# Patient Record
Sex: Female | Born: 1980 | Smoking: Never smoker
Health system: Southern US, Community
[De-identification: ages and names within clinical notes are randomized; demographics above are authoritative.]

## PROBLEM LIST (undated history)

## (undated) DIAGNOSIS — F419 Anxiety disorder, unspecified: Secondary | ICD-10-CM

## (undated) HISTORY — PX: DILATION AND CURETTAGE OF UTERUS: SHX78

## (undated) HISTORY — DX: Anxiety disorder, unspecified: F41.9

## (undated) HISTORY — PX: LAPAROSCOPY: SHX197

---

## 2017-07-11 ENCOUNTER — Other Ambulatory Visit: Payer: Self-pay | Admitting: Obstetrics and Gynecology

## 2017-07-11 ENCOUNTER — Other Ambulatory Visit (HOSPITAL_COMMUNITY)
Admission: RE | Admit: 2017-07-11 | Discharge: 2017-07-11 | Disposition: A | Discharge status: Home / Self Care | Payer: No Typology Code available for payment source | Source: Ambulatory Visit | Attending: Obstetrics and Gynecology | Admitting: Obstetrics and Gynecology

## 2017-07-11 DIAGNOSIS — Z01419 Encounter for gynecological examination (general) (routine) without abnormal findings: Secondary | ICD-10-CM | POA: Insufficient documentation

## 2017-07-14 LAB — CYTOLOGY - PAP
Diagnosis: NEGATIVE
HPV: NOT DETECTED

## 2017-11-02 ENCOUNTER — Other Ambulatory Visit: Payer: Self-pay | Admitting: Internal Medicine

## 2017-11-02 DIAGNOSIS — R42 Dizziness and giddiness: Secondary | ICD-10-CM

## 2017-11-06 ENCOUNTER — Ambulatory Visit
Admission: RE | Admit: 2017-11-06 | Discharge: 2017-11-06 | Disposition: A | Discharge status: Home / Self Care | Payer: No Typology Code available for payment source | Source: Ambulatory Visit | Attending: Internal Medicine | Admitting: Internal Medicine

## 2017-11-06 DIAGNOSIS — R42 Dizziness and giddiness: Secondary | ICD-10-CM

## 2018-10-25 ENCOUNTER — Other Ambulatory Visit: Payer: Self-pay | Admitting: Gastroenterology

## 2018-10-25 DIAGNOSIS — R1084 Generalized abdominal pain: Secondary | ICD-10-CM

## 2018-11-01 ENCOUNTER — Inpatient Hospital Stay: Admission: RE | Admit: 2018-11-01 | Payer: No Typology Code available for payment source | Source: Ambulatory Visit

## 2018-11-20 ENCOUNTER — Other Ambulatory Visit: Payer: Self-pay | Admitting: Gastroenterology

## 2018-11-20 DIAGNOSIS — R1033 Periumbilical pain: Secondary | ICD-10-CM

## 2018-11-20 DIAGNOSIS — R1084 Generalized abdominal pain: Secondary | ICD-10-CM

## 2018-11-22 ENCOUNTER — Ambulatory Visit
Admission: RE | Admit: 2018-11-22 | Discharge: 2018-11-22 | Disposition: A | Discharge status: Home / Self Care | Payer: PRIVATE HEALTH INSURANCE | Source: Ambulatory Visit | Attending: Gastroenterology | Admitting: Gastroenterology

## 2018-11-22 ENCOUNTER — Other Ambulatory Visit: Payer: Self-pay

## 2018-11-22 DIAGNOSIS — R1084 Generalized abdominal pain: Secondary | ICD-10-CM

## 2018-11-22 DIAGNOSIS — R1033 Periumbilical pain: Secondary | ICD-10-CM

## 2018-11-22 MED ORDER — IOPAMIDOL (ISOVUE-300) INJECTION 61%
100.0000 mL | Freq: Once | INTRAVENOUS | Status: AC | PRN
  Administered 2018-11-22: 100 mL via INTRAVENOUS

## 2019-05-25 ENCOUNTER — Ambulatory Visit: Payer: PRIVATE HEALTH INSURANCE | Attending: Internal Medicine

## 2019-05-25 DIAGNOSIS — Z23 Encounter for immunization: Secondary | ICD-10-CM

## 2019-05-25 NOTE — Progress Notes (Signed)
   Covid-19 Vaccination Clinic  Name:  Danielle Wall    MRN: [id number] DOB: [birth year]  05/25/2019  Ms. [name] was observed post Covid-19 immunization for 15 minutes without incident. She was provided with Vaccine Information Sheet and instruction to access the V-Safe system.   Ms. [name] was instructed to call 911 with any severe reactions post vaccine: Marland Kitchen Difficulty breathing  . Swelling of face and throat  . A fast heartbeat  . A bad rash all over body  . Dizziness and weakness   Immunizations Administered    Name Date Dose VIS Date Route   Moderna COVID-19 Vaccine 05/25/2019 12:16 PM 0.5 mL 01/23/2019 Intramuscular   Manufacturer: Moderna   Lot: [id number]   NDC: 09470-962-83

## 2019-07-24 ENCOUNTER — Ambulatory Visit (INDEPENDENT_AMBULATORY_CARE_PROVIDER_SITE_OTHER): Payer: PRIVATE HEALTH INSURANCE | Admitting: Dermatology

## 2019-07-24 ENCOUNTER — Other Ambulatory Visit: Payer: Self-pay

## 2019-07-24 ENCOUNTER — Encounter: Payer: Self-pay | Admitting: Dermatology

## 2019-07-24 DIAGNOSIS — L0100 Impetigo, unspecified: Secondary | ICD-10-CM | POA: Diagnosis not present

## 2019-07-24 DIAGNOSIS — L2489 Irritant contact dermatitis due to other agents: Secondary | ICD-10-CM | POA: Diagnosis not present

## 2019-07-24 MED ORDER — MUPIROCIN 2 % EX OINT: 1.0000 "application " | TOPICAL_OINTMENT | Freq: Two times a day (BID) | CUTANEOUS | 1 refills | Status: AC

## 2019-07-24 MED ORDER — HYDROCORTISONE 2.5 % EX CREA: TOPICAL_CREAM | Freq: Two times a day (BID) | CUTANEOUS | 1 refills | Status: AC | PRN

## 2019-07-24 NOTE — Progress Notes (Signed)
   Follow-Up Visit   Subjective  Danielle Wall is a 39 y.o. female who presents for the following: Skin Problem (went to have eyebrows and upper lip waxed saturday- "ripped skin off" tx- merderma and vasoline).  Rash Location: Face Duration: 3 days Quality: Peeling crusts Associated Signs/Symptoms: Modifying Factors: Waxing for facial hair Severity:  Timing: Context:   The following portions of the chart were reviewed this encounter and updated as appropriate:     Objective  Well appearing patient in no apparent distress; mood and affect are within normal limits.  A focused examination was performed including Head and neck.. Relevant physical exam findings are noted in the Assessment and Plan.   Assessment & Plan  Impetigo (4) Right Upper Cutaneous Lip; Left Lower Cutaneous Lip; Left Buccal Cheek ; Right Buccal Cheek   Mupirocin b.I.d. x 7 days then 2.5% HC ointment x 2 weeks.  Irritant contact dermatitis due to other agents (5) Left Upper Eyelid; Right Upper Eyelid; Right Melolabial Fold; Left Buccal Cheek ; Philtrum  Mupirocin followed by 2 half percent hydrocortisone ointment  First follow-up in several years for Danielle Wall.  On Saturday 3 days ago she had a new technician wax hair on her upper lip and above her eyes with immediate burning which the technician said was normal.  This was followed by persistent burning and discomfort and peeling of her skin.  Today there are dark peeling areas above her upper lip and an impetiginized patch below the left outer lip.  There is also linear erythema above each upper eyelid in areas where there is actually no normal hair growth so neither she now nor I understand why this area was treated.  Initially we will cover the secondary bacteria with 1 week of twice daily mupirocin ointment applied morning and night.  If the acute inflammation is improved, she will stop the mupirocin and pick up the second prescription for generic 2-1/2%  hydrocortisone ointment which she will apply nightly for 2 weeks and then stop.  Each day when she is going out she should use at least a #50 sunblock.  I suspect the acute inflammation will rapidly improve, but with her beautiful coffee colored skin Danielle Wall is at high risk for healing with hyperpigmentation.  If this happens, we may add a skin Lightner in the late fall.  Use of topical retinoids plus AHA before she went for waxing may have been a predisposing factor for her having such a severe reaction.  I have asked Danielle Wall to please call me in 1 week to give me a verbal report that there is improvement and I will plan on seeing her in 2 months to see if she is developing hyperpigmentation.

## 2019-07-24 NOTE — Patient Instructions (Addendum)
First follow-up in several years for Danielle Wall.  On Saturday 3 days ago she had a new technician wax hair on her upper lip and above her eyes with immediate burning which the technician said was normal.  This was followed by persistent burning and discomfort and peeling of her skin.  Today there are dark peeling areas above her upper lip and an impetiginized patch below the left outer lip.  There is also linear erythema above each upper eyelid in areas where there is actually no normal hair growth so neither she now nor I understand why this area was treated.  Initially we will cover the secondary bacteria with 1 week of twice daily mupirocin ointment applied morning and night.  If the acute inflammation is improved, she will stop the mupirocin and pick up the second prescription for generic 2-1/2% hydrocortisone ointment which she will apply nightly for 2 weeks and then stop.  Each day when she is going out she should use at least a #50 sunblock.  I suspect the acute inflammation will rapidly improve, but with her beautiful coffee colored skin Danielle Wall is at high risk for healing with hyperpigmentation.  If this happens, we may add a skin Lightner in the late fall.  Use of topical retinoids plus AHA before she went for waxing may have been a predisposing factor for her having such a severe reaction.  I have asked Danielle Wall to please call me in 1 week to give me a verbal report that there is improvement and I will plan on seeing her in 2 months to see if she is developing hyperpigmentation.

## 2019-07-30 ENCOUNTER — Encounter: Payer: Self-pay | Admitting: Dermatology

## 2019-07-31 ENCOUNTER — Telehealth: Payer: Self-pay | Admitting: Dermatology

## 2019-07-31 NOTE — Telephone Encounter (Signed)
See patient note

## 2019-07-31 NOTE — Telephone Encounter (Signed)
Patient is calling to find out what is the recommend whitening treatment for patient's skin from Dr. Jorja Loa.  Patient asks if this is a prescription she can pick up? Patient's pharmacy is [facility] on [address] and [address]. [phone]

## 2019-08-01 NOTE — Telephone Encounter (Signed)
In spring/summer, DrT prefers an otc 3% hydroquinone (Nadinola plus or other). Daily 50+ SPF. If no response, in late fall I'l Rx Triluma.

## 2019-08-02 NOTE — Telephone Encounter (Signed)
Patient aware of Dr Sherryl Barters message and will update [place] in the late fall.

## 2019-09-05 ENCOUNTER — Other Ambulatory Visit: Payer: Self-pay

## 2019-09-05 ENCOUNTER — Ambulatory Visit: Payer: PRIVATE HEALTH INSURANCE | Admitting: Dermatology

## 2019-09-05 ENCOUNTER — Ambulatory Visit (INDEPENDENT_AMBULATORY_CARE_PROVIDER_SITE_OTHER): Payer: PRIVATE HEALTH INSURANCE | Admitting: Dermatology

## 2019-09-05 DIAGNOSIS — L2489 Irritant contact dermatitis due to other agents: Secondary | ICD-10-CM

## 2019-10-01 ENCOUNTER — Encounter: Payer: Self-pay | Admitting: Dermatology

## 2019-10-01 NOTE — Progress Notes (Signed)
   Follow-Up Visit   Subjective  Danielle Wall is a 39 y.o. female who presents for the following: Skin Problem (Impetigo 1 month follow up,  Also wants spot on right upper cheek asessed).  Contact dermatitis Location: Face Duration:  Quality: Much improved Associated Signs/Symptoms: Modifying Factors:  Severity:  Timing: Context:   Objective  Well appearing patient in no apparent distress; mood and affect are within normal limits.  A focused examination of the head neck was performed.   Assessment & Plan    Irritant contact dermatitis due to other agents Right Upper Cutaneous Lip  We will hand over-the-counter topical hydroquinone plus a #50-100 SPF sunblock daily for 10 weeks.  Follow-up by phone at that time.     I, Janalyn Harder, MD, have reviewed all documentation for this visit.  The documentation on 10/01/19 for the exam, diagnosis, procedures, and orders are all accurate and complete.

## 2019-12-10 ENCOUNTER — Ambulatory Visit: Payer: PRIVATE HEALTH INSURANCE | Admitting: Dermatology

## 2019-12-12 ENCOUNTER — Encounter: Payer: Self-pay | Admitting: Dermatology

## 2019-12-12 ENCOUNTER — Ambulatory Visit (INDEPENDENT_AMBULATORY_CARE_PROVIDER_SITE_OTHER): Payer: PRIVATE HEALTH INSURANCE | Admitting: Dermatology

## 2019-12-12 ENCOUNTER — Other Ambulatory Visit: Payer: Self-pay

## 2019-12-12 DIAGNOSIS — L858 Other specified epidermal thickening: Secondary | ICD-10-CM | POA: Diagnosis not present

## 2019-12-12 DIAGNOSIS — L819 Disorder of pigmentation, unspecified: Secondary | ICD-10-CM

## 2019-12-12 DIAGNOSIS — L2489 Irritant contact dermatitis due to other agents: Secondary | ICD-10-CM

## 2019-12-12 NOTE — Patient Instructions (Signed)
Routine follow-up for Danielle Wall will (date of birth August 31, 1980).  The inflammation and hyperpigmentation after hair removal procedure has healed beautifully with virtually no residual pigmentation, no active skin infection, and no scar around the lips and even the eyelids have done very well.  We also discussed a little tiny hair bumps on her upper arms called keratosis pilaris.  Wall told her that topical retinoids may temporarily produce less bumps but routinely produce irritation and she was content to not use this.  We also discussed the many factors that can produce subtle darkening below the eyes and if this becomes more bothersome to Mrs. Rinall in the future she may consult Dr. Josephine Igo in [place].  Otherwise follow-up can be on a as needed basis.

## 2020-01-21 ENCOUNTER — Encounter: Payer: Self-pay | Admitting: Dermatology

## 2020-01-21 NOTE — Progress Notes (Signed)
   Follow-Up Visit   Subjective  Danielle Wall is a 39 y.o. female who presents for the following: Follow-up (contact dermatits due to waxing- better tx- none).  Contact dermatitis Location: Face Duration:  Quality:  Associated Signs/Symptoms: Modifying Factors:  Severity:  Timing: Context:   Objective  Well appearing patient in no apparent distress; mood and affect are within normal limits.  A focused examination was performed including Head and neck. Relevant physical exam findings are noted in the Assessment and Plan.   Assessment & Plan    Keratosis pilaris (2) Left Upper Arm - Anterior; Right Upper Arm - Anterior  Benign okay not to treat.  Post-inflammatory pigmentary changes (2) Left Malar Cheek; Right Malar Cheek  Patient would need to see a Cosmetic Dermatologist if she wants treatment.  Recommend Beatrix Fetters.  (Dermatologist in [place])  Irritant contact dermatitis due to other agents Right Upper Cutaneous Lip  Follow up PRN  Routine follow-up for Danielle Wall (date of birth 01-08-1981).  The inflammation and hyperpigmentation after hair removal procedure has healed beautifully with virtually no residual pigmentation, no active skin infection, and no scar around the lips and even the eyelids have done very well.  We also discussed a little tiny hair bumps on her upper arms called keratosis pilaris.  I told her that topical retinoids may temporarily produce less bumps but routinely produce irritation and she was content to not use this.  We also discussed the many factors that can produce subtle darkening below the eyes and if this becomes more bothersome to Danielle Wall in the future she may consult Dr. Josephine Igo in [place].  Otherwise follow-up can be on a as needed basis.   I, Janalyn Harder, MD, have reviewed all documentation for this visit.  The documentation on 01/21/20 for the exam, diagnosis, procedures, and orders are all accurate and  complete.

## 2021-05-15 ENCOUNTER — Other Ambulatory Visit: Payer: Self-pay | Admitting: Obstetrics and Gynecology

## 2021-05-15 ENCOUNTER — Ambulatory Visit
Admission: RE | Admit: 2021-05-15 | Discharge: 2021-05-15 | Disposition: A | Discharge status: Home / Self Care | Payer: No Typology Code available for payment source | Source: Ambulatory Visit | Attending: Dermatology | Admitting: Dermatology

## 2021-05-15 ENCOUNTER — Other Ambulatory Visit: Payer: Self-pay | Admitting: Dermatology

## 2021-05-15 DIAGNOSIS — Z1231 Encounter for screening mammogram for malignant neoplasm of breast: Secondary | ICD-10-CM

## 2021-05-18 ENCOUNTER — Other Ambulatory Visit: Payer: Self-pay | Admitting: Obstetrics and Gynecology

## 2021-05-18 DIAGNOSIS — R928 Other abnormal and inconclusive findings on diagnostic imaging of breast: Secondary | ICD-10-CM

## 2021-06-12 ENCOUNTER — Ambulatory Visit
Admission: RE | Admit: 2021-06-12 | Discharge: 2021-06-12 | Disposition: A | Discharge status: Home / Self Care | Payer: No Typology Code available for payment source | Source: Ambulatory Visit | Attending: Obstetrics and Gynecology | Admitting: Obstetrics and Gynecology

## 2021-06-12 ENCOUNTER — Other Ambulatory Visit: Payer: Self-pay | Admitting: Obstetrics and Gynecology

## 2021-06-12 DIAGNOSIS — R928 Other abnormal and inconclusive findings on diagnostic imaging of breast: Secondary | ICD-10-CM

## 2021-06-12 DIAGNOSIS — N6310 Unspecified lump in the right breast, unspecified quadrant: Secondary | ICD-10-CM

## 2021-12-18 ENCOUNTER — Other Ambulatory Visit: Payer: Self-pay | Admitting: Obstetrics and Gynecology

## 2021-12-18 ENCOUNTER — Ambulatory Visit
Admission: RE | Admit: 2021-12-18 | Discharge: 2021-12-18 | Disposition: A | Discharge status: Home / Self Care | Payer: No Typology Code available for payment source | Source: Ambulatory Visit | Attending: Obstetrics and Gynecology | Admitting: Obstetrics and Gynecology

## 2021-12-18 DIAGNOSIS — N6310 Unspecified lump in the right breast, unspecified quadrant: Secondary | ICD-10-CM

## 2021-12-18 DIAGNOSIS — N6489 Other specified disorders of breast: Secondary | ICD-10-CM

## 2022-06-25 ENCOUNTER — Ambulatory Visit
Admission: RE | Admit: 2022-06-25 | Discharge: 2022-06-25 | Disposition: A | Discharge status: Home / Self Care | Payer: No Typology Code available for payment source | Source: Ambulatory Visit | Attending: Obstetrics and Gynecology | Admitting: Obstetrics and Gynecology

## 2022-06-25 DIAGNOSIS — N6310 Unspecified lump in the right breast, unspecified quadrant: Secondary | ICD-10-CM

## 2022-06-25 DIAGNOSIS — N6489 Other specified disorders of breast: Secondary | ICD-10-CM

## 2022-07-09 ENCOUNTER — Ambulatory Visit (INDEPENDENT_AMBULATORY_CARE_PROVIDER_SITE_OTHER): Payer: No Typology Code available for payment source | Admitting: Podiatry

## 2022-07-09 DIAGNOSIS — L600 Ingrowing nail: Secondary | ICD-10-CM | POA: Diagnosis not present

## 2022-07-09 NOTE — Progress Notes (Signed)
  Subjective:  Patient ID: Danielle Wall, female    DOB: September 16, 1980,  MRN: [id number]  Chief Complaint  Patient presents with   Nail Problem    42 y.o. female presents with the above complaint.  Patient presents with right side medial border ingrown.  She states that she occasionally occasionally notices some pain.  Overall not very painful.  She wanted to discuss treatment options for it.  She denies any other acute complaints.   Review of Systems: Negative except as noted in the HPI. Denies N/V/F/Ch.  No past medical history on file.  Current Outpatient Medications:    hydrocortisone 2.5 % cream, Apply topically 2 (two) times daily as needed (Rash). (Patient not taking: Reported on 12/12/2019), Disp: 30 g, Rfl: 1   mupirocin ointment (BACTROBAN) 2 %, Apply 1 application topically 2 (two) times daily. (Patient not taking: Reported on 12/12/2019), Disp: 22 g, Rfl: 1  Social History   Tobacco Use  Smoking Status Never  Smokeless Tobacco Never    No Known Allergies Objective:  There were no vitals filed for this visit. There is no height or weight on file to calculate BMI. Constitutional Well developed. Well nourished.  Vascular Dorsalis pedis pulses palpable bilaterally. Posterior tibial pulses palpable bilaterally. Capillary refill normal to all digits.  No cyanosis or clubbing noted. Pedal hair growth normal.  Neurologic Normal speech. Oriented to person, place, and time. Epicritic sensation to light touch grossly present bilaterally.  Dermatologic Painful ingrowing nail at medial nail borders of the hallux nail right. No other open wounds. No skin lesions.  Orthopedic: Normal joint ROM without pain or crepitus bilaterally. No visible deformities. No bony tenderness.   Radiographs: None Assessment:   1. Ingrown toenail of right foot    Plan:  Patient was evaluated and treated and all questions answered.  Ingrown Nail, right -Clinically I discussed all the  options for ingrown nail.  Including shoe gear modification and offloading padding protecting.  If it continues to bother her we will plan on doing an ingrown nail procedure.  At this time patient will continue to manage it conservatively.  No follow-ups on file.

## 2022-12-27 ENCOUNTER — Other Ambulatory Visit (HOSPITAL_BASED_OUTPATIENT_CLINIC_OR_DEPARTMENT_OTHER): Payer: Self-pay | Admitting: Internal Medicine

## 2022-12-27 DIAGNOSIS — R519 Headache, unspecified: Secondary | ICD-10-CM

## 2022-12-31 ENCOUNTER — Ambulatory Visit (HOSPITAL_BASED_OUTPATIENT_CLINIC_OR_DEPARTMENT_OTHER)
Admission: RE | Admit: 2022-12-31 | Discharge: 2022-12-31 | Disposition: A | Discharge status: Home / Self Care | Payer: No Typology Code available for payment source | Source: Ambulatory Visit | Attending: Internal Medicine | Admitting: Internal Medicine

## 2022-12-31 DIAGNOSIS — R519 Headache, unspecified: Secondary | ICD-10-CM | POA: Diagnosis present

## 2023-01-07 ENCOUNTER — Encounter: Payer: Self-pay | Admitting: Neurology

## 2023-03-16 ENCOUNTER — Encounter: Payer: Self-pay | Admitting: Neurology

## 2023-04-06 ENCOUNTER — Other Ambulatory Visit (INDEPENDENT_AMBULATORY_CARE_PROVIDER_SITE_OTHER): Payer: No Typology Code available for payment source

## 2023-04-06 ENCOUNTER — Other Ambulatory Visit (INDEPENDENT_AMBULATORY_CARE_PROVIDER_SITE_OTHER): Payer: Self-pay

## 2023-04-06 ENCOUNTER — Ambulatory Visit (INDEPENDENT_AMBULATORY_CARE_PROVIDER_SITE_OTHER): Payer: No Typology Code available for payment source | Admitting: Orthopaedic Surgery

## 2023-04-06 DIAGNOSIS — M25562 Pain in left knee: Secondary | ICD-10-CM

## 2023-04-06 DIAGNOSIS — G8929 Other chronic pain: Secondary | ICD-10-CM

## 2023-04-06 DIAGNOSIS — M25561 Pain in right knee: Secondary | ICD-10-CM

## 2023-04-06 NOTE — Progress Notes (Signed)
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  Her and her husband exercise on a regular basis and I do see them walk the neighborhood on a regular basis.  She has been dealing with knee pain with the left greater than right and this is been going on and off for a while now with no known injury.  It does hurt more with going up and down stairs and walking with hills.  She does get crepitation behind both patellas.  She is thin and active.  She has no significant medical issues at all.  However, her mother is a patient of mine and her mother has significant varus malalignment of both knees and we have replaced 1 knee and need to replace her other knee.  Examination of both knees today shows that both knees to slightly hyperextend which is normal.  Both knees have significant patellofemoral crepitation which is worse on the left than the right.  Both knees have full range of motion and are ligamentously stable.  Neither knee has an effusion today at all.  Standing AP and lateral both knees today show patellofemoral arthritic changes.  The medial and lateral compartments of both knees are well-maintained with excellent space.  Also the alignment of both knees are neutral.  Seeing the x-rays of her knees did give her some reassurance based on genetics and how her mother's knees look.  We have talked about her exercise routine and I want her to avoid squats and lunges and she will work on quad strengthening exercises.  She has tried knee sleeves and those really do not help.  I think will help her the most is modifying her activities and workout as well as strengthening her quad muscles.  She does understand that she will always have some crepitation with the patellofemoral arthritis and there is not much that we can do about that.  She could always have steroid injections or even hyaluronic acid injections in the future if her pain does not subside.  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  All questions and concerns were answered and addressed.

## 2023-04-25 ENCOUNTER — Ambulatory Visit: Payer: No Typology Code available for payment source | Admitting: Neurology

## 2023-04-25 NOTE — Progress Notes (Unsigned)
 NEUROLOGY CONSULTATION NOTE  Danielle Wall MRN: [id number] DOB: 30-Aug-1980  Referring provider: Lorenda Ishihara, MD Primary care provider: Lorenda Ishihara, MD  Reason for consult:  migraines  Assessment/Plan:   Migraine without aura, without status migrainosus, not intractable   Migraine prevention:  Start taking topiramate 50mg  daily at bedtime Migraine rescue:  Rizatriptan 10mg , Zofran 4mg  for nausea Limit use of pain relievers to no more than 2 days out of week to prevent risk of rebound or medication-overuse headache. Keep headache diary Follow up 6 months.    Subjective:  Danielle Wall is a 43 year old right-handed female who presents for migraines.  History supplemented by her accompanying mother and referring provider's note.   Onset:  since college Location:  bilateral frontal Quality:  pounding Intensity:  7/10 Aura:  absent Prodrome:  absent Associated symptoms:  Nausea, word-finding difficulty.  Veins on forehead "pop out".  She denies associated vomiting, photophobia, phonophobia, osmophobia, visual disturbance, dizziness, neck pain, autonomic symptoms, unilateral numbness or weakness. Duration:  1-2 days.  In November 2024, lasted 5 days. Frequency:  2 days a week. Triggers:  work-related stress Relieving factors:  walk, exercise, plays with her children  Activity:  aggravates  CT HEAD WO on 12/31/2022 personally reviewed revealed no acute intracranial process and demonstrated mucosal thickening and air-fluid levels in the bilateral maxillary sinuses and left frontal sinus.  Prior MRI BRAIN WO on 11/06/2017 performed to evaluated headache and dizziness, personally reviewed, was normal.      Past NSAIDS/analgesics:  ibuprofen 800mg  Past abortive triptans:  none Past abortive ergotamine:  none Past muscle relaxants:  none Past anti-emetic:  none Past antihypertensive medications:  none Past antidepressant medications:  none Past  anticonvulsant medications:  none Past anti-CGRP:  none Past vitamins/Herbal/Supplements:  none Past antihistamines/decongestants:  none Other past therapies:  none  Current NSAIDS/analgesics:  Tylenol Current triptans:  none Current ergotamine:  none Current anti-emetic:  none Current muscle relaxants:  none Current Antihypertensive medications:  none Current Antidepressant medications:  none Current Anticonvulsant medications:  topiramate 50mg  daily (only taking it as needed) Current anti-CGRP:  none Current Vitamins/Herbal/Supplements:  magnesium Current Antihistamines/Decongestants:  none Other therapy:  none Birth control:  Mirena Other medications:  buspirone   Caffeine:  2 cups of tea daily.  Sometimes coffee Alcohol:  socially (once a week) Smoker:  no Diet:  60-70 oz water daily.  May skips meals Exercise:  yes Depression:  no; Anxiety:  yes Sleep hygiene:  Good.  7 hours a night. Family history of headache:  mom, maternal grandmother      PAST MEDICAL HISTORY: No past medical history on file.  PAST SURGICAL HISTORY: No past surgical history on file.  MEDICATIONS: Current Outpatient Medications on File Prior to Visit  Medication Sig Dispense Refill   hydrocortisone 2.5 % cream Apply topically 2 (two) times daily as needed (Rash). (Patient not taking: Reported on 12/12/2019) 30 g 1   mupirocin ointment (BACTROBAN) 2 % Apply 1 application topically 2 (two) times daily. (Patient not taking: Reported on 12/12/2019) 22 g 1   No current facility-administered medications on file prior to visit.    ALLERGIES: No Known Allergies  FAMILY HISTORY: No family history on file.  Objective:  Blood pressure 105/70, pulse 60, height 5\' 3"  (1.6 m), weight 134 lb (60.8 kg), SpO2 97%. General: No acute distress.  Patient appears well-groomed.   Head:  Normocephalic/atraumatic Eyes:  fundi examined but not visualized Neck: supple, no paraspinal tenderness, full range  of  motion Heart: regular rate and rhythm Neurological Exam: Mental status: alert and oriented to person, place, and time, speech fluent and not dysarthric, language intact. Cranial nerves: CN I: not tested CN II: pupils equal, round and reactive to light, visual fields intact CN III, IV, VI:  full range of motion, no nystagmus, no ptosis CN V: facial sensation intact. CN VII: upper and lower face symmetric CN VIII: hearing intact CN IX, X: gag intact, uvula midline CN XI: sternocleidomastoid and trapezius muscles intact CN XII: tongue midline Bulk & Tone: normal, no fasciculations. Motor:  muscle strength 5/5 throughout Sensation:  Pinprick and vibratory sensation intact. Deep Tendon Reflexes:  2+ throughout,  toes downgoing.   Finger to nose testing:  Without dysmetria.    Gait:  Normal station and stride.  Romberg negative.    Thank you for allowing me to take part in the care of this patient.  Shon Millet, DO  CC: Lorenda Ishihara, MD

## 2023-04-26 ENCOUNTER — Encounter: Payer: Self-pay | Admitting: Neurology

## 2023-04-26 ENCOUNTER — Ambulatory Visit (INDEPENDENT_AMBULATORY_CARE_PROVIDER_SITE_OTHER): Payer: No Typology Code available for payment source | Admitting: Neurology

## 2023-04-26 VITALS — BP 105/70 | HR 60 | Ht 63.0 in | Wt 134.0 lb

## 2023-04-26 DIAGNOSIS — G43009 Migraine without aura, not intractable, without status migrainosus: Secondary | ICD-10-CM | POA: Diagnosis not present

## 2023-04-26 MED ORDER — RIZATRIPTAN BENZOATE 10 MG PO TABS: 10.0000 mg | ORAL_TABLET | ORAL | 5 refills | Status: AC | PRN

## 2023-04-26 MED ORDER — ONDANSETRON HCL 4 MG PO TABS: 4.0000 mg | ORAL_TABLET | Freq: Three times a day (TID) | ORAL | 5 refills | Status: AC | PRN

## 2023-04-26 MED ORDER — TOPIRAMATE 50 MG PO TABS
50.0000 mg | ORAL_TABLET | Freq: Every day | ORAL | 5 refills | Status: AC
Start: 2023-04-26 — End: ?

## 2023-04-26 NOTE — Patient Instructions (Signed)
  Take topiramate 50mg  at bedtime.  Contact us in 6 weeks with update and we can increase dose if needed. Take rizatriptan 10mg  at earliest onset of headache.  May repeat dose once in 2 hours if needed.  Maximum 2 tablets in 24 hours. Take ondansetron for nausea associated with migraine Limit use of pain relievers to no more than 2 days out of the week.  These medications include acetaminophen, NSAIDs (ibuprofen/Advil/Motrin, naproxen/Aleve, triptans (Imitrex/sumatriptan), Excedrin, and narcotics.  This will help reduce risk of rebound headaches. Be aware of common food triggers Routine exercise Stay adequately hydrated (aim for 64 oz water daily) Keep headache diary Maintain proper stress management Maintain proper sleep hygiene Do not skip meals Consider supplements:  magnesium citrate 400mg  daily, riboflavin 400mg  daily, coenzyme Q10 300mg  daily

## 2023-06-17 ENCOUNTER — Other Ambulatory Visit (HOSPITAL_COMMUNITY)
Admission: RE | Admit: 2023-06-17 | Discharge: 2023-06-17 | Disposition: A | Discharge status: Home / Self Care | Source: Ambulatory Visit | Attending: Obstetrics and Gynecology | Admitting: Obstetrics and Gynecology

## 2023-06-17 ENCOUNTER — Other Ambulatory Visit: Payer: Self-pay | Admitting: Obstetrics and Gynecology

## 2023-06-17 DIAGNOSIS — Z01419 Encounter for gynecological examination (general) (routine) without abnormal findings: Secondary | ICD-10-CM | POA: Diagnosis present

## 2023-06-21 LAB — CYTOLOGY - PAP
Comment: NEGATIVE
Diagnosis: NEGATIVE
High risk HPV: NEGATIVE

## 2023-06-28 ENCOUNTER — Other Ambulatory Visit: Payer: Self-pay | Admitting: Internal Medicine

## 2023-06-28 DIAGNOSIS — N6489 Other specified disorders of breast: Secondary | ICD-10-CM

## 2023-08-05 ENCOUNTER — Ambulatory Visit
Admission: RE | Admit: 2023-08-05 | Discharge: 2023-08-05 | Disposition: A | Discharge status: Home / Self Care | Source: Ambulatory Visit | Attending: Internal Medicine | Admitting: Internal Medicine

## 2023-08-05 DIAGNOSIS — N6489 Other specified disorders of breast: Secondary | ICD-10-CM

## 2023-10-14 ENCOUNTER — Encounter (INDEPENDENT_AMBULATORY_CARE_PROVIDER_SITE_OTHER): Payer: Self-pay

## 2023-10-28 ENCOUNTER — Ambulatory Visit: Admitting: Neurology

## 2023-11-23 ENCOUNTER — Ambulatory Visit (INDEPENDENT_AMBULATORY_CARE_PROVIDER_SITE_OTHER): Admitting: Otolaryngology

## 2023-11-23 ENCOUNTER — Encounter (INDEPENDENT_AMBULATORY_CARE_PROVIDER_SITE_OTHER): Payer: Self-pay | Admitting: Otolaryngology

## 2023-11-23 VITALS — BP 103/70 | HR 74

## 2023-11-23 DIAGNOSIS — J381 Polyp of vocal cord and larynx: Secondary | ICD-10-CM | POA: Diagnosis not present

## 2023-11-23 DIAGNOSIS — R490 Dysphonia: Secondary | ICD-10-CM | POA: Diagnosis not present

## 2023-11-23 MED ORDER — METHYLPREDNISOLONE 4 MG PO TBPK: ORAL_TABLET | ORAL | 1 refills | Status: AC

## 2023-11-23 MED ORDER — SULFAMETHOXAZOLE-TRIMETHOPRIM 800-160 MG PO TABS: 1.0000 | ORAL_TABLET | Freq: Two times a day (BID) | ORAL | 0 refills | Status: AC

## 2023-11-23 NOTE — Progress Notes (Signed)
 ENT CONSULT:  Reason for Consult: chronic dysphonia    HPI: Discussed the use of AI scribe software for clinical note transcription with the patient, who gave verbal consent to proceed.  History of Present Illness Danielle Wall is a 43 year old female who presents with changes in her voice for the past 3-4 months.  She has experienced voice changes characterized by fluctuations with periods of improvement followed by sudden inability to speak. Her voice sometimes becomes silent while talking.  She has a history of migraines, previously treated with topiramate , which she no longer takes due to side effects. She reports no current migraines. She has experienced nasal congestion and facial pressure in the past, associated with her migraines.  She has a history of heartburn or reflux but is not currently taking any medication for it. She reports significant allergies this year, with symptoms including drainage from her eyes and nose, although she typically does not experience such symptoms.  No trouble with swallowing but significant throat congestion and sensation of mucus blockage.     Records Reviewed:  Neurology 04/26/23 Migraine prevention:  Start taking topiramate  50mg  daily at bedtime Migraine rescue:  Rizatriptan  10mg , Zofran  4mg  for nausea Limit use of pain relievers to no more than 2 days out of week to prevent risk of rebound or medication-overuse headache. Keep headache diary Follow up 6 months.    Past Medical History:  Diagnosis Date   Anxiety     Past Surgical History:  Procedure Laterality Date   CESAREAN SECTION     X 2   DILATION AND CURETTAGE OF UTERUS     LAPAROSCOPY      History reviewed. No pertinent family history.  Social History:  reports that she has never smoked. She has never used smokeless tobacco. She reports that she does not drink alcohol and does not use drugs.  Allergies: No Known Allergies  Medications: I have reviewed the patient's  current medications.  The PMH, PSH, Medications, Allergies, and SH were reviewed and updated.  ROS: Constitutional: Negative for fever, weight loss and weight gain. Cardiovascular: Negative for chest pain and dyspnea on exertion. Respiratory: Is not experiencing shortness of breath at rest. Gastrointestinal: Negative for nausea and vomiting. Neurological: Negative for headaches. Psychiatric: The patient is not nervous/anxious  Blood pressure 103/70, pulse 74, SpO2 99%. There is no height or weight on file to calculate BMI.  PHYSICAL EXAM:  Exam: General: Well-developed, well-nourished Communication and Voice: Clear pitch and clarity Respiratory Respiratory effort: Equal inspiration and expiration without stridor Cardiovascular Peripheral Vascular: Warm extremities with equal color/perfusion Eyes: No nystagmus with equal extraocular motion bilaterally Neuro/Psych/Balance: Patient oriented to person, place, and time; Appropriate mood and affect; Gait is intact with no imbalance; Cranial nerves I-XII are intact Head and Face Inspection: Normocephalic and atraumatic without mass or lesion Palpation: Facial skeleton intact without bony stepoffs Salivary Glands: No mass or tenderness Facial Strength: Facial motility symmetric and full bilaterally ENT Pinna: External ear intact and fully developed External canal: Canal is patent with intact skin Tympanic Membrane: Clear and mobile External Nose: No scar or anatomic deformity Internal Nose: Septum is deviated to the left. No polyp, or purulence. Mucosal edema and erythema present.  Bilateral inferior turbinate hypertrophy.  Lips, Teeth, and gums: Mucosa and teeth intact and viable TMJ: No pain to palpation with full mobility Oral cavity/oropharynx: No erythema or exudate, no lesions present Nasopharynx: No mass or lesion with intact mucosa Hypopharynx: Intact mucosa without pooling of secretions Larynx  Glottic: Full true vocal cord  mobility with R > L hemorrhagic polyps Supraglottic: Normal appearing epiglottis and AE folds Interarytenoid Space: Moderate pachydermia&edema Subglottic Space: Patent without lesion or edema Neck Neck and Trachea: Midline trachea without mass or lesion Thyroid: No mass or nodularity Lymphatics: No lymphadenopathy  Procedure:  Preoperative diagnosis: hoarseness   Postoperative diagnosis:   same + R > L hemorrhagic polyps  Procedure: Flexible fiberoptic laryngoscopy with stroboscopy (68420)   Surgeon: Aeliana Spates, MD  Anesthesia: Topical lidocaine and Afrin  Complications: None  Condition is stable throughout exam  Indications and consent:   The patient presents to the clinic with hoarseness. All the risks, benefits, and potential complications were reviewed with the patient preoperatively and informed verbal consent was obtained.  Procedure: The patient was seated upright in the exam chair.   Topical lidocaine and Afrin were applied to the nasal cavity. After adequate anesthesia had occurred, the flexible telescope with strobe capabilities was passed into the nasal cavity. The nasopharynx was patent without mass or lesion. The scope was passed behind the soft palate and directed toward the base of tongue. The base of tongue was visualized and was symmetric with no apparent masses or abnormal appearing tissue. There were no signs of a mass or pooling of secretions in the piriform sinuses. The supraglottic structures were normal.  The true vocal cords are mobile. The medial edges were with hemorrhagic polyps along the edges, mid 1/3. Closure was incomplete. Periodicity present. The mucosal wave and amplitude were intact aside from the site of lesions. 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  The laryngoscope was then slowly withdrawn and the patient tolerated the procedure well. There were no complications or blood loss.  Studies Reviewed: CT head  12/31/22 Sinuses/Orbits: Mucosal thickening in the ethmoid air cells. Mucosal thickening and air-fluid levels in the bilateral maxillary sinuses and left frontal sinus. No acute finding in the orbits.   Other: The mastoid air cells are well aerated.   IMPRESSION: 1. No acute intracranial process. 2. Mucosal thickening and air-fluid levels in the bilateral maxillary sinuses and left frontal sinus, which can be seen in the setting of acute sinusitis.  Assessment/Plan: Encounter Diagnoses  Name Primary?   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  Hemorrhagic polyps likely due to vocal strain. Spontaneous resolution expected; surgery not first line. We will do voice rest x 48 hrs and steroids/bactrim, and will re-evaluate. Will refer to SLP for voice hygiene education to prevent in the future.  - Medrol dose pack + Bactrim DS x 2 weeks - Advised two days of voice rest, avoid whispering or shouting, use normal voice if necessary. - Recommended communication via dry erase board or texting during voice rest. - Referred to speech therapy for voice hygiene education. - Discussed potential surgical intervention if polyps persist.      Thank you for allowing me to participate in the care of this patient. Please do not hesitate to contact me with any questions or concerns.   Elena Larry, MD Otolaryngology [facility] Phone: [phone] Fax: [phone]    11/23/2023, 2:39 PM

## 2023-12-23 IMAGING — MG DIGITAL DIAGNOSTIC BILAT W/ TOMO W/ CAD
8 series · 8 of 24 positions shown · non-contrast
Comparison: Baseline screening mammogram dated 05/15/2021.

CLINICAL DATA: Patient returns today to evaluate possible bilateral
breast asymmetries questioned on recent baseline screening
mammogram.



[R CC synth-2D]
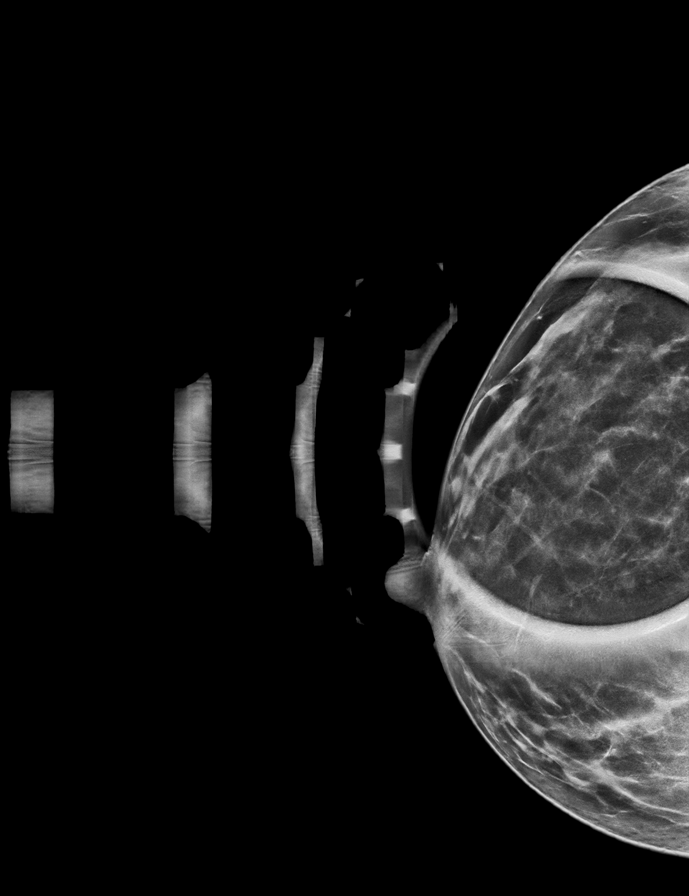

[L MLO synth-2D]
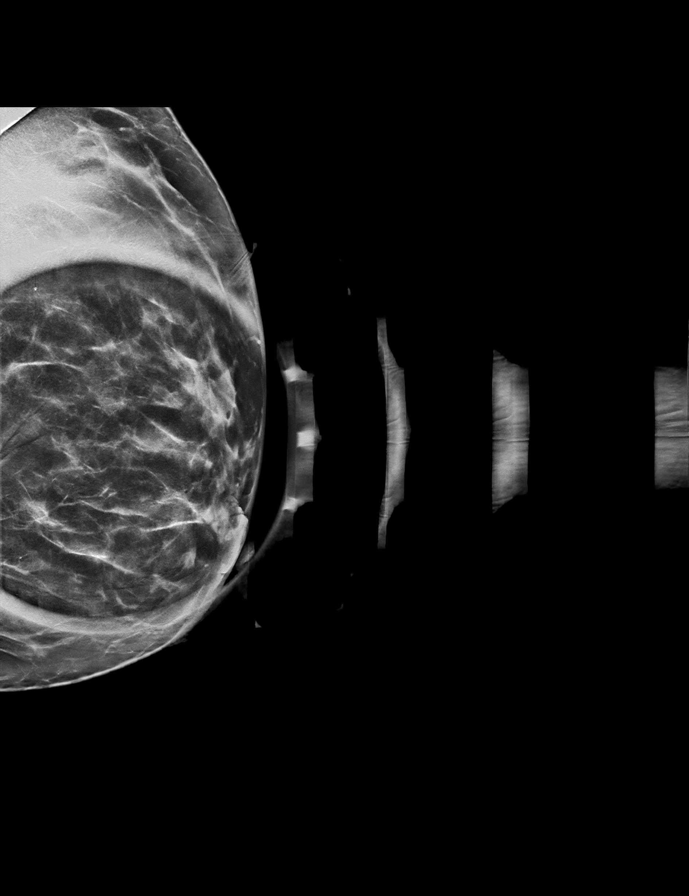

[L CC synth-2D]
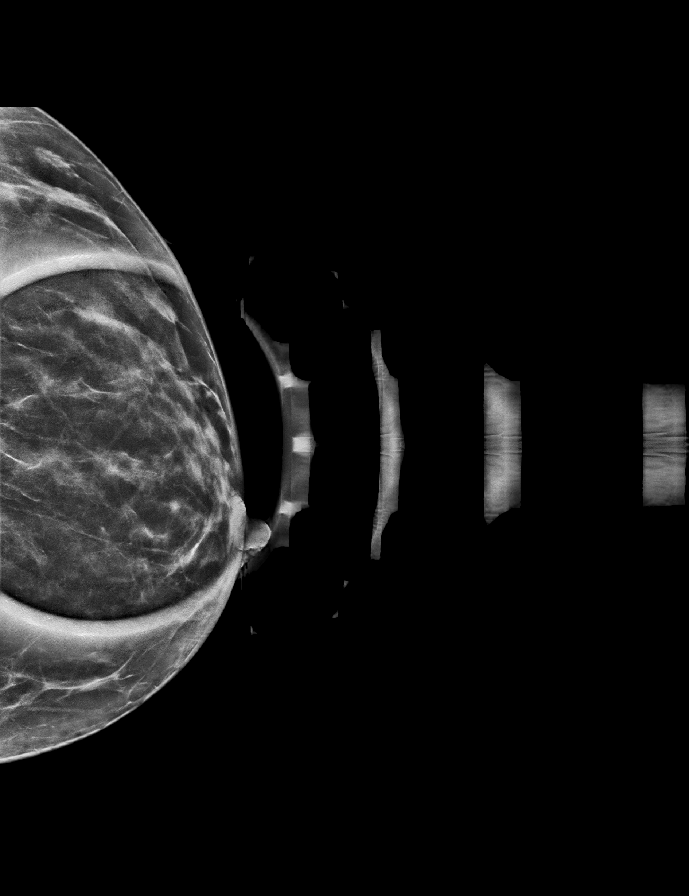

[R MLO synth-2D]
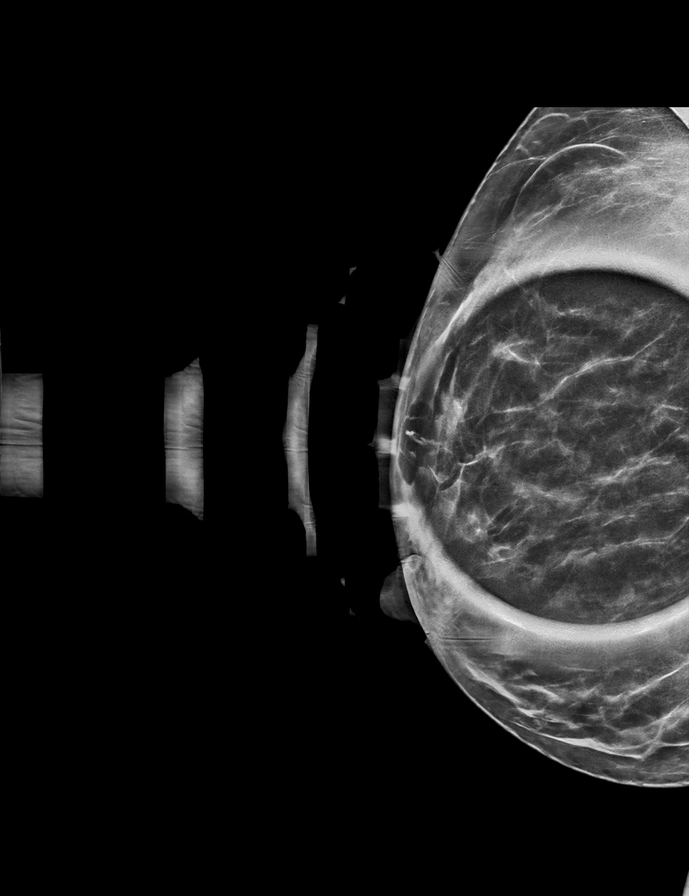

[R CC tomo · tomo slice 29/56.0]
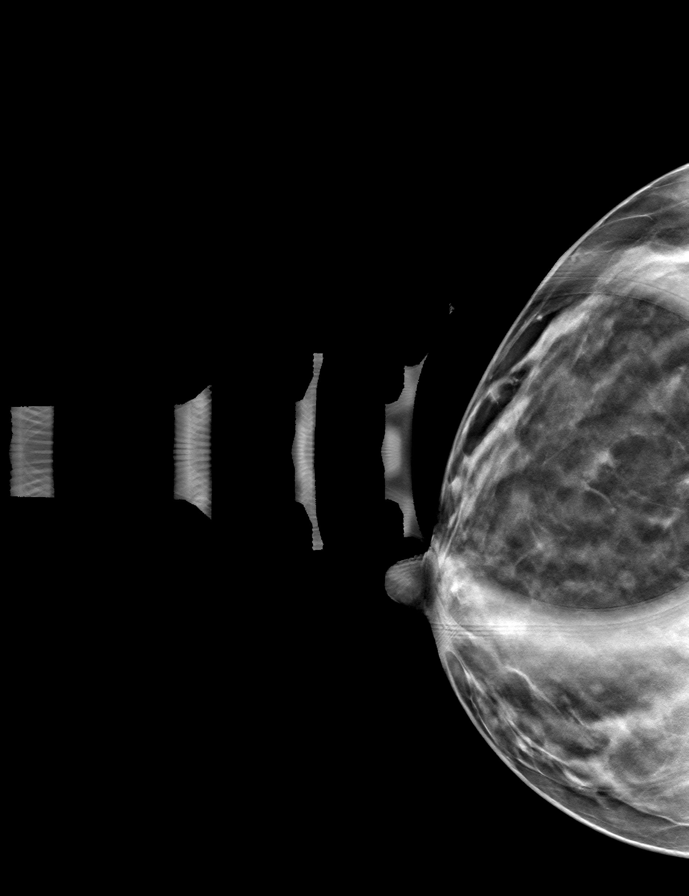

[L CC tomo · tomo slice 31/60.0]
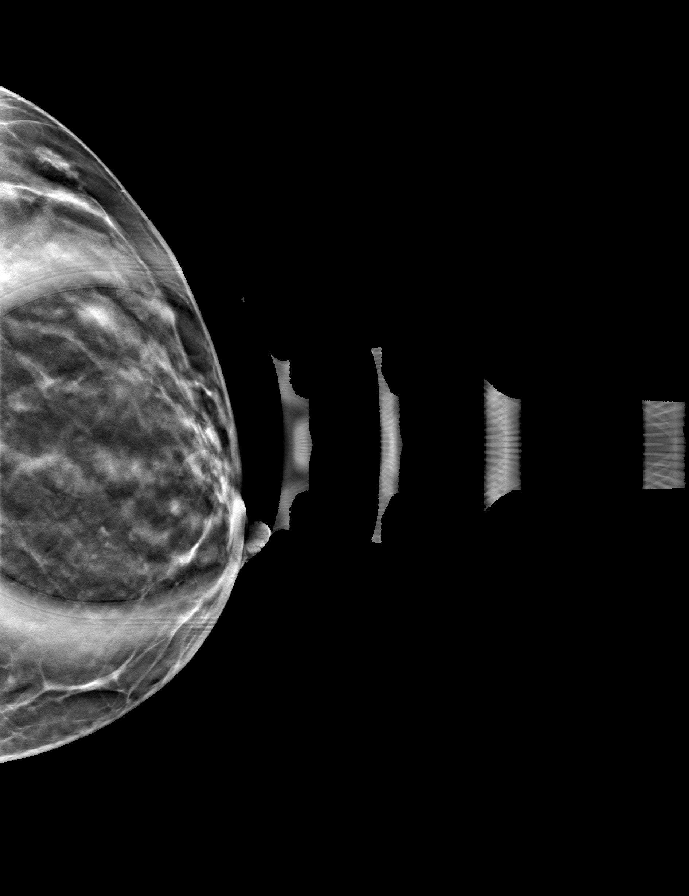

[L MLO tomo · tomo slice 35/69.0]
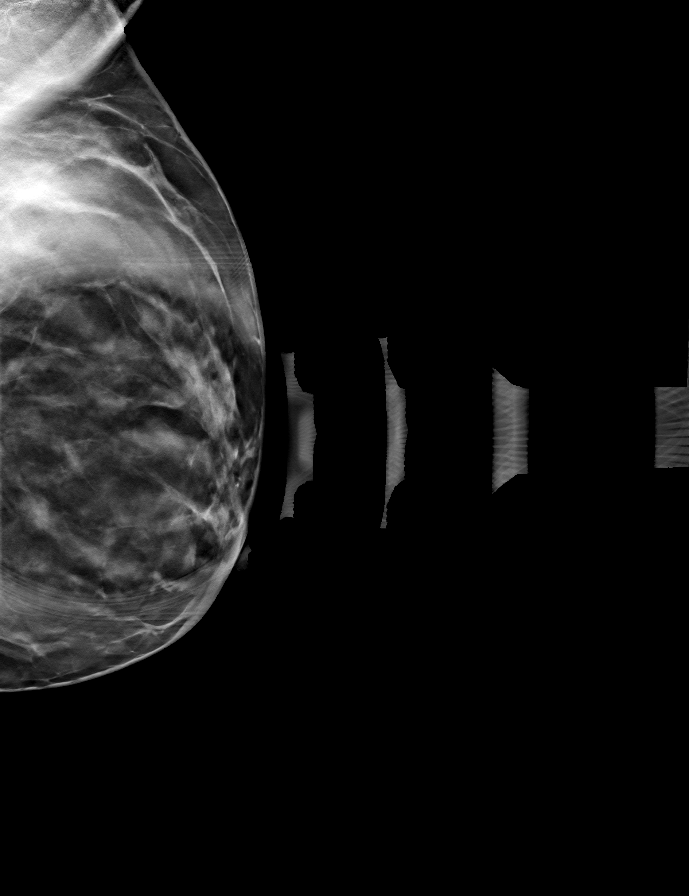

[R MLO tomo · tomo slice 33/65.0]
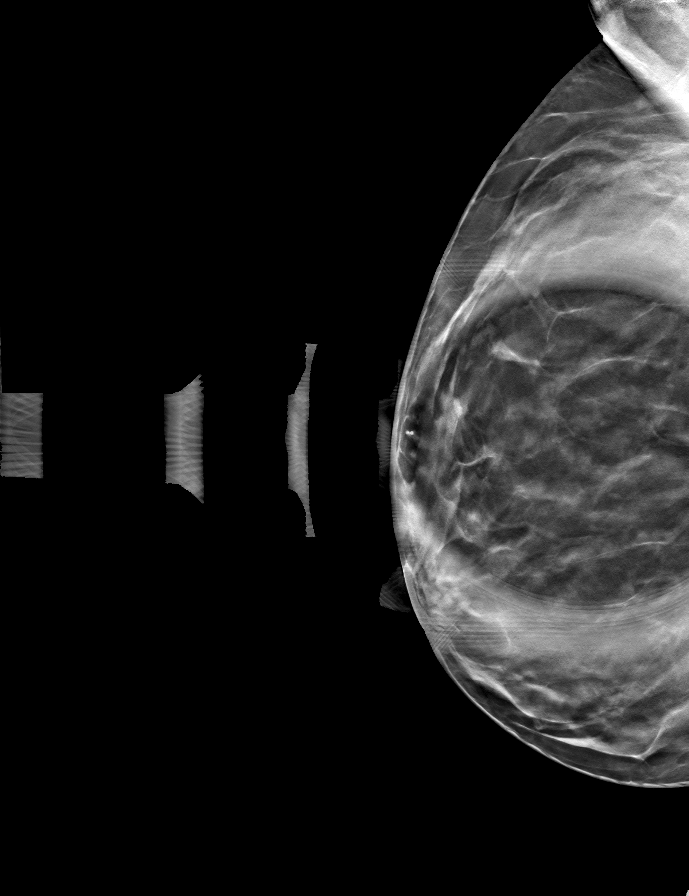

[8 of 24 positions shown; findings below may reference images not displayed]

ACR Breast Density Category c: The breast tissue is heterogeneously
dense, which may obscure small masses.
FINDINGS: RIGHT breast diagnostic mammogram: On today's additional diagnostic
views with spot compression and 3D tomosynthesis, there is a
partially obscured, partially circumscribed, mass confirmed within
the outer RIGHT breast, measuring approximately 1 cm greatest
dimension. Ultrasound will be performed for further
characterization.

LEFT breast diagnostic mammogram: On today's additional diagnostic
views with spot compression and 3D tomosynthesis, there is no
persistent asymmetry within the LEFT breast suggesting
superimposition of normal dense fibroglandular tissues. Ultrasound
will be performed to ensure benignity.

RIGHT breast: Targeted ultrasound is performed, showing an oval
circumscribed hypoechoic mass at the 11 o'clock axis, 3 cm from the
nipple, measuring 8 x 3 x 7 mm, without internal vascularity, most
suggestive of a benign fibroadenoma.

LEFT breast: Targeted ultrasound is performed, showing only normal
dense fibroglandular tissues and fat lobules within the upper LEFT
breast, 12-2 o'clock axes, corresponding to the mammographic
findings.
IMPRESSION: 1. Probably benign fibroadenoma in the RIGHT breast at the 11
o'clock axis, 3 cm from the nipple, measuring 8 x 3 x 7 mm,
corresponding to the mammographic finding. Recommend follow-up RIGHT
breast diagnostic mammogram, and possible ultrasound, in 6 months to
ensure stability.
2. Probably benign island of normal dense fibroglandular tissues
within the upper LEFT breast, without sonographic correlate.
Recommend follow-up LEFT breast diagnostic mammogram in 6 months to
ensure stability.

RECOMMENDATION:
Bilateral diagnostic mammogram, and possible RIGHT breast
ultrasound, in 6 months.

I have discussed the findings and recommendations with the patient.
If applicable, a reminder letter will be sent to the patient
regarding the next appointment.

BI-RADS CATEGORY  3: Probably benign.

## 2023-12-26 ENCOUNTER — Encounter: Payer: Self-pay | Admitting: Radiology

## 2024-01-06 ENCOUNTER — Ambulatory Visit

## 2024-01-12 ENCOUNTER — Ambulatory Visit: Attending: Otolaryngology

## 2024-01-12 DIAGNOSIS — J381 Polyp of vocal cord and larynx: Secondary | ICD-10-CM | POA: Insufficient documentation

## 2024-01-12 DIAGNOSIS — R490 Dysphonia: Secondary | ICD-10-CM | POA: Diagnosis present

## 2024-01-12 NOTE — Therapy (Signed)
 OUTPATIENT SPEECH LANGUAGE PATHOLOGY VOICE EVALUATION   Patient Name: Danielle Wall MRN: [id number] DOB:05/07/1980, 43 y.o., female Today's Date: 01/12/2024  PCP: Elliot Charm, MD REFERRING PROVIDER: Okey Burns, MD  END OF SESSION:  End of Session - 01/12/24 1321     Visit Number 1    Number of Visits 7    Date for Recertification  02/23/24    SLP Start Time 1240    SLP Stop Time  1317    SLP Time Calculation (min) 37 min    Activity Tolerance Patient tolerated treatment well          Past Medical History:  Diagnosis Date   Anxiety    Past Surgical History:  Procedure Laterality Date   CESAREAN SECTION     X 2   DILATION AND CURETTAGE OF UTERUS     LAPAROSCOPY     There are no active problems to display for this patient.   Onset date: 4-5 months ago.  REFERRING DIAG:  J38.1 (ICD-10-CM) - Vocal cord polyp  R49.0 (ICD-10-CM) - Dysphonia  R49.0 (ICD-10-CM) - Hoarseness    THERAPY DIAG:  Dysphonia  Rationale for Evaluation and Treatment: Rehabilitation  SUBJECTIVE:   SUBJECTIVE STATEMENT: When loud, my voice is worse. Pt accompanied by: self and family member  PERTINENT HISTORY: From Dr. Vallery note on 11/23/23:  Kirstan Fentress is a 43 year old female who presents with changes in her voice for the past 3-4 months.   She has experienced voice changes characterized by fluctuations with periods of improvement followed by sudden inability to speak. Her voice sometimes becomes silent while talking.   She has a history of migraines, previously treated with topiramate , which she no longer takes due to side effects. She reports no current migraines. She has experienced nasal congestion and facial pressure in the past, associated with her migraines.   She has a history of heartburn or reflux but is not currently taking any medication for it. She reports significant allergies this year, with symptoms including drainage from her eyes and  nose, although she typically does not experience such symptoms.   No trouble with swallowing but significant throat congestion and sensation of mucus blockage.  PAIN:  Are you having pain? No  FALLS: Has patient fallen in last 6 months? No, Number of falls: N/A  LIVING ENVIRONMENT: Lives with: lives with their family and lives with their spouse Lives in: House/apartment  PLOF:Level of assistance: Independent with ADLs Employment: Full-time employment  PATIENT GOALS: To improve voice.   OBJECTIVE:  Note: Objective measures were completed at Evaluation unless otherwise noted.  DIAGNOSTIC FINDINGS: Procedure: The patient was seated upright in the exam chair.   Topical lidocaine and Afrin were applied to the nasal cavity. After adequate anesthesia had occurred, the flexible telescope with strobe capabilities was passed into the nasal cavity. The nasopharynx was patent without mass or lesion. The scope was passed behind the soft palate and directed toward the base of tongue. The base of tongue was visualized and was symmetric with no apparent masses or abnormal appearing tissue. There were no signs of a mass or pooling of secretions in the piriform sinuses. The supraglottic structures were normal.   The true vocal cords are mobile. The medial edges were with hemorrhagic polyps along the edges, mid 1/3. Closure was incomplete. Periodicity present. The mucosal wave and amplitude were intact aside from the site of lesions. 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  The laryngoscope was then  slowly withdrawn and the patient tolerated the procedure well. There were no complications or blood loss.  COGNITION: Overall cognitive status: Within functional limits for tasks assessed   SOCIAL HISTORY: Occupation: Environmental Education Officer at [employer]. Water intake: N/A Caffeine/alcohol intake: N/A Daily voice use: Between moderate and excessive.  PERCEPTUAL VOICE  ASSESSMENT: Voice quality: hoarse, breathy, and rough Vocal abuse: excessive voice use Resonance: normal Respiratory function: thoracic breathing and clavicular breathing GRBAS: G - 1, R - 1, B - 1, A - 1, S - 0  OBJECTIVE VOICE ASSESSMENT: Maximum phonation time for sustained ah: 9.1 seconds Conversational pitch average: 221 Hz Conversational pitch range: 112 (169-281 Hz) Hz Conversational loudness average: 66 dB Conversational loudness range: 45-72 dB   PATIENT REPORTED OUTCOME MEASURES (PROM): PROM was not completed during session and will be addressed in upcoming session.                                                                                                                            TREATMENT DATE:   01/12/24: Pt's mom was present for session. SLP completed voice evaluation. SLP initiated voice therapy. SLP performed stimulability for resonant voice therapy (RVT), SOVTEs, and respiratory retraining. Pt was successful with utilizing diaphragmatic breathing and RVT/SOVTEs to achieve WNL vocal quality. SLP educated pt about physiology of voice and how it connects to voice therapy strategies. Pt verbalized understanding; will need reinforcement in upcoming sessions. Plan is to initiate voice therapy, implementing RVT and SOVTEs, as well as vocal hygiene strategies for minimizing laryngeal hyperfunction.    PATIENT EDUCATION: Education details: Physiology of voice (breathing, phonation, and articulation/resonance). Person educated: Patient and Parent Education method: Medical Illustrator Education comprehension: verbalized understanding and needs further education  HOME EXERCISE PROGRAM: HEP will be developed in upcoming sessions.   GOALS: Goals reviewed with patient? Yes  SHORT TERM GOALS: Target date: 02/02/2024  Pt will utilize structured voice exercises to achieve WNL vocal quality in 90% of opportunities given occasional min A. Baseline: Goal status:  INITIAL  2.  Pt will perform diaphragmatic breathing during structured voice exercises to balance respiratory support for phonation in 80% of opportunities given occasional min A. Baseline:  Goal status: INITIAL  3.  Pt will complete voice PROM.  Baseline:  Goal status: INITIAL  4.  Pt will adhere to voice HEP program across 2 sessions to maximize generalization of skills for optimized outcomes.  Baseline:  Goal status: INITIAL  5.  Pt will show emergence of implementing vocal hygiene strategies to decrease laryngeal hyperfunction in daily living across 2 sessions.  Baseline:  Goal status: INITIAL   LONG TERM GOALS: Target date: 02/23/2024  Pt will achieve WNL vocal quality. Baseline:  Goal status: INITIAL  2.  Pt will utilize diaphragmatic breathing to achieve healthy and optimal phonation with rare min A.  Baseline:  Goal status: INITIAL  3.  Pt will consistently utilize vocal hygiene strategies to maximize healthy phonation  and voice habits.  Baseline:  Goal status: INITIAL    ASSESSMENT:  CLINICAL IMPRESSION: Patient is a 43 y.o. female who was seen today for a voice evaluation. Pt presents with a mild dysphonia characterized by roughness, slight breathiness, speaking on residual capacity, and hx of hemorrhagic vocal polyps (11/23/2023). She noted that her voice is 60% better since seeing laryngologist; however, her voice is not fully there yet. Pt stated that she uses her voice a lot during the day and that she is an excessive talker. Pt's voice is worse when she has to get loud with her kids. Pt has moments where she has excessive saliva, which messes with her when lying down (will inquire more about this in upcoming sessions). According to stimulability performance, pt is a great candidate for voice therapy. Pt has 15-20 minutes of free time at home for voice home exercises.   OBJECTIVE IMPAIRMENTS: include voice disorder. These impairments are limiting patient from  effectively communicating at home and in community. Factors affecting potential to achieve goals and functional outcome are N/A.SABRA Patient will benefit from skilled SLP services to address above impairments and improve overall function.  REHAB POTENTIAL: Excellent  PLAN:  SLP FREQUENCY: 1x/week  SLP DURATION: 6 weeks  PLANNED INTERVENTIONS: SLP instruction and feedback, Patient/family education, and resonant voice therapy, semi-occluded vocal tract exercises, vocal hygiene education.    W[employer]ell [employer], CF-SLP 01/12/2024, 2:00 PM

## 2024-01-13 ENCOUNTER — Ambulatory Visit

## 2024-01-17 ENCOUNTER — Ambulatory Visit

## 2024-01-17 DIAGNOSIS — R490 Dysphonia: Secondary | ICD-10-CM | POA: Diagnosis not present

## 2024-01-17 NOTE — Therapy (Signed)
 OUTPATIENT SPEECH LANGUAGE PATHOLOGY VOICE TREATMENT   Patient Name: Danielle Wall MRN: [id number] DOB:08-15-1980, 43 y.o., female Today's Date: 01/17/2024  PCP: Elliot Charm, MD REFERRING PROVIDER: Okey Burns, MD  END OF SESSION:  End of Session - 01/17/24 1635     Visit Number 2    Number of Visits 7    Date for Recertification  02/23/24    SLP Start Time 1317    SLP Stop Time  1400    SLP Time Calculation (min) 43 min    Activity Tolerance Patient tolerated treatment well           Past Medical History:  Diagnosis Date   Anxiety    Past Surgical History:  Procedure Laterality Date   CESAREAN SECTION     X 2   DILATION AND CURETTAGE OF UTERUS     LAPAROSCOPY     There are no active problems to display for this patient.   Onset date: 4-5 months ago.  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 What were the voice exercises he was going to give me?  Pt accompanied by: self and children  PERTINENT HISTORY: From Dr. Vallery note on 11/23/23:  Danielle Wall is a 43 year old female who presents with changes in her voice for the past 3-4 months.   She has experienced voice changes characterized by fluctuations with periods of improvement followed by sudden inability to speak. Her voice sometimes becomes silent while talking.   She has a history of migraines, previously treated with topiramate , which she no longer takes due to side effects. She reports no current migraines. She has experienced nasal congestion and facial pressure in the past, associated with her migraines.   She has a history of heartburn or reflux but is not currently taking any medication for it. She reports  significant allergies this year, with symptoms including drainage from her eyes and nose, although she typically does not experience such symptoms.   No trouble with swallowing but significant throat congestion and sensation of mucus blockage.  PAIN:  Are you having pain? No  FALLS: Has patient fallen in last 6 months? No, Number of falls: N/A   PATIENT GOALS: To improve voice.   OBJECTIVE:  Note: Objective measures were completed at Evaluation unless otherwise noted.  DIAGNOSTIC FINDINGS: Procedure: The patient was seated upright in the exam chair.   Topical lidocaine and Afrin were applied to the nasal cavity. After adequate anesthesia had occurred, the flexible telescope with strobe capabilities was passed into the nasal cavity. The nasopharynx was patent without mass or lesion. The scope was passed behind the soft palate and directed toward the base of tongue. The base of tongue was visualized and was symmetric with no apparent masses or abnormal appearing tissue. There were no signs of a mass or pooling of secretions in the piriform sinuses. The supraglottic structures were normal.   The true vocal cords are mobile. The medial edges were with hemorrhagic polyps along the edges, mid 1/3. Closure was incomplete. Periodicity present. The mucosal wave and amplitude were intact aside from the site of lesions. 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  The laryngoscope was then slowly withdrawn and the patient tolerated the  procedure well. There were no complications or blood loss.   PATIENT REPORTED OUTCOME MEASURES (PROM): PROM was not completed during session and will be addressed in upcoming session.                                                                                                                            TREATMENT DATE:  Resonant Voice therapy = RVT; Semi-Occluded Vocal Tract Exercises = SOVTE  01/17/24: Pt will need PROM next session.   SLP  reviewed pt's goals with her and pt agreed with goals. SLP introduced vocal hygiene strategies for pt (see pt instructions). Stressed to pt that she will likely need to work on limiting throat clearing and limiting frequency of talking. Throat clears today in this session were 13. SLP got pt cup of water after 8 minutes, and throat clears diminished after this (5). Given pt's S about exercises, SLP re-introduced RVT and with SLP guidance pt worked with /m/ and /b/ phonemes with pt initially benefiting from tactile cue of finger on lips but faded this cue when pt agreed with SLP that her voice sounded WNL. Pt progressed to multiple /m/ words in succession (e.g., my morning music) with WNL voicing, however intonation was not like connected speech but more like separate words. SLP told pt to practice with single /m/ words (10) with a forward voicing focus, then mix 2-3 /m/ words in phrases focusing on feeling vibration in lips and feeling a voice forward production. SLP also encouraged pt to add /n/ words into 2-3 word phrases if she desires, feeling vibration in nose primarily (and mouth).   01/12/24: Pt's mom was present for session. SLP completed voice evaluation. SLP initiated voice therapy. SLP performed stimulability for resonant voice therapy (RVT), SOVTEs, and respiratory retraining. Pt was successful with utilizing diaphragmatic breathing and RVT/SOVTEs to achieve WNL vocal quality. SLP educated pt about physiology of voice and how it connects to voice therapy strategies. Pt verbalized understanding; will need reinforcement in upcoming sessions. Plan is to initiate voice therapy, implementing RVT and SOVTEs, as well as vocal hygiene strategies for minimizing laryngeal hyperfunction.    PATIENT EDUCATION: Education details: see Today's treatment Person educated: Patient Education method: Explanation and Demonstration Education comprehension: verbalized understanding, returned demonstration,  verbal cues required, and needs further education  HOME EXERCISE PROGRAM: HEP will be developed in upcoming sessions.   GOALS: Goals reviewed with patient? Yes  SHORT TERM GOALS: Target date: 02/02/2024  Pt will utilize structured voice exercises to achieve WNL vocal quality in 90% of opportunities given occasional min A. Baseline: Goal status: INITIAL  2.  Pt will perform diaphragmatic breathing during structured voice exercises to balance respiratory support for phonation in 80% of opportunities given occasional min A. Baseline:  Goal status: INITIAL  3.  Pt will complete voice PROM.  Baseline:  Goal status: INITIAL  4.  Pt will adhere to voice HEP program across 2 sessions to maximize generalization of skills  for optimized outcomes.  Baseline:  Goal status: INITIAL  5.  Pt will show emergence of implementing vocal hygiene strategies to decrease laryngeal hyperfunction in daily living across 2 sessions.  Baseline:  Goal status: INITIAL   LONG TERM GOALS: Target date: 02/23/2024  Pt will achieve WNL vocal quality. Baseline:  Goal status: INITIAL  2.  Pt will utilize diaphragmatic breathing to achieve healthy and optimal phonation with rare min A.  Baseline:  Goal status: INITIAL  3.  Pt will consistently utilize vocal hygiene strategies to maximize healthy phonation and voice habits.  Baseline:  Goal status: INITIAL    ASSESSMENT:  CLINICAL IMPRESSION: Patient is a 43 y.o. female who was seen today for voice therapy. 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 See treatment date above for today's date for further details on today's session.   On eval date she noted that her voice is 60% better since seeing laryngologist; however, her voice is not fully there yet. Pt stated that she uses her voice a lot during the day and that she is an  excessive talker. Pt's voice is worse when she has to get loud with her kids. Pt has moments where she has excessive saliva, which messes with her when lying down (will inquire more about this in upcoming sessions). According to stimulability performance, pt is a great candidate for voice therapy. Pt has 15-20 minutes of free time at home for voice home exercises.   OBJECTIVE IMPAIRMENTS: include voice disorder. These impairments are limiting patient from effectively communicating at home and in community. Factors affecting potential to achieve goals and functional outcome are N/A.SABRA Patient will benefit from skilled SLP services to address above impairments and improve overall function.  REHAB POTENTIAL: Excellent  PLAN:  SLP FREQUENCY: 1x/week  SLP DURATION: 6 weeks  PLANNED INTERVENTIONS: SLP instruction and feedback, Patient/family education, and resonant voice therapy, semi-occluded vocal tract exercises, vocal hygiene education.    Waddell Music, CF-SLP 01/17/2024, 4:36 PM

## 2024-01-17 NOTE — Patient Instructions (Addendum)
   Money  More Mole Melt  My Music  Moon Move Milk  Mango Magic  Mold Map  Movie Milkshake Machine  Month  Menu Mouth  Moth

## 2024-01-26 ENCOUNTER — Ambulatory Visit: Attending: Otolaryngology

## 2024-01-26 DIAGNOSIS — R490 Dysphonia: Secondary | ICD-10-CM | POA: Insufficient documentation

## 2024-01-26 NOTE — Therapy (Signed)
 OUTPATIENT SPEECH LANGUAGE PATHOLOGY VOICE TREATMENT   Patient Name: Danielle Wall MRN: [id number] DOB:[birth year], 43 y.o., female Today's Date: 01/26/2024  PCP: Danielle Charm, MD REFERRING PROVIDER: Okey Burns, MD  END OF SESSION:  End of Session - 01/26/24 1625     Visit Number 3    Number of Visits 7    Date for Recertification  02/23/24    SLP Start Time 1404    SLP Stop Time  1441    SLP Time Calculation (min) 37 min            Past Medical History:  Diagnosis Date   Anxiety    Past Surgical History:  Procedure Laterality Date   CESAREAN SECTION     X 2   DILATION AND CURETTAGE OF UTERUS     LAPAROSCOPY     There are no active problems to display for this patient.   Onset date: 4-5 months ago.  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 What were the voice exercises he was going to give me?  Pt accompanied by: self and children  PERTINENT HISTORY: From Dr. Vallery note on 11/23/23:  Danielle Wall is a 43 year old female who presents with changes in her voice for the past 3-4 months.   She has experienced voice changes characterized by fluctuations with periods of improvement followed by sudden inability to speak. Her voice sometimes becomes silent while talking.   She has a history of migraines, previously treated with topiramate , which she no longer takes due to side effects. She reports no current migraines. She has experienced nasal congestion and facial pressure in the past, associated with her migraines.   She has a history of heartburn or reflux but is not currently taking any medication for it. She reports significant allergies this year, with symptoms  including drainage from her eyes and nose, although she typically does not experience such symptoms.   No trouble with swallowing but significant throat congestion and sensation of mucus blockage.  PAIN:  Are you having pain? No  FALLS: Has patient fallen in last 6 months? No, Number of falls: N/A   PATIENT GOALS: To improve voice.   OBJECTIVE:  Note: Objective measures were completed at Evaluation unless otherwise noted.  DIAGNOSTIC FINDINGS: Procedure: The patient was seated upright in the exam chair.   Topical lidocaine and Afrin were applied to the nasal cavity. After adequate anesthesia had occurred, the flexible telescope with strobe capabilities was passed into the nasal cavity. The nasopharynx was patent without mass or lesion. The scope was passed behind the soft palate and directed toward the base of tongue. The base of tongue was visualized and was symmetric with no apparent masses or abnormal appearing tissue. There were no signs of a mass or pooling of secretions in the piriform sinuses. The supraglottic structures were normal.   The true vocal cords are mobile. The medial edges were with hemorrhagic polyps along the edges, mid 1/3. Closure was incomplete. Periodicity present. The mucosal wave and amplitude were intact aside from the site of lesions. 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  The laryngoscope was then slowly withdrawn and the patient tolerated the procedure well. There were no complications or blood  loss.   PATIENT REPORTED OUTCOME MEASURES (PROM): PROM was not completed during session and will be addressed in upcoming session.                                                                                                                            TREATMENT DATE:  Resonant Voice therapy = RVT; Semi-Occluded Vocal Tract Exercises = SOVTE  01/26/24: Pt has been trying to practice her RVT homework and reducing throat clearing at home.  SLP reviewed RVT with /m and n/ words, phrases, and sentences. Pt utilized diaphragmatic breathing during structured exercises with 85% consistency when given frequent min A. Pt utilized forward resonance and diaphragmatic breathing during RVT exercises targeting /m, n/ words, phrases, and sentences with 90% consistency when given frequent min A. Pt is demonstrating increasing self-awareness of forward vs. Back resonance for optimal phonation. Pt notes that she she uses multiple language throughout day. SLP collaborated with pt to begin creating functional phrases in target languages ([place] and English) for carry-over of voice strategies into daily conversation. Plan is to target sentence-level voice production using English and [place] sources for maximal generalization of balanced phonation, regardless of language utilized.   01/17/24: Pt will need PROM next session.   SLP reviewed pt's goals with her and pt agreed with goals. SLP introduced vocal hygiene strategies for pt (see pt instructions). Stressed to pt that she will likely need to work on limiting throat clearing and limiting frequency of talking. Throat clears today in this session were 13. SLP got pt cup of water after 8 minutes, and throat clears diminished after this (5). Given pt's S about exercises, SLP re-introduced RVT and with SLP guidance pt worked with /m/ and /b/ phonemes with pt initially benefiting from tactile cue of finger on lips but faded this cue when pt agreed with SLP that her voice sounded WNL. Pt progressed to multiple /m/ words in succession (e.g., my morning music) with WNL voicing, however intonation was not like connected speech but more like separate words. SLP told pt to practice with single /m/ words (10) with a forward voicing focus, then mix 2-3 /m/ words in phrases focusing on feeling vibration in lips and feeling a voice forward production. SLP also encouraged pt to add /n/ words into 2-3 word phrases if she  desires, feeling vibration in nose primarily (and mouth).   01/12/24: Pt's mom was present for session. SLP completed voice evaluation. SLP initiated voice therapy. SLP performed stimulability for resonant voice therapy (RVT), SOVTEs, and respiratory retraining. Pt was successful with utilizing diaphragmatic breathing and RVT/SOVTEs to achieve WNL vocal quality. SLP educated pt about physiology of voice and how it connects to voice therapy strategies. Pt verbalized understanding; will need reinforcement in upcoming sessions. Plan is to initiate voice therapy, implementing RVT and SOVTEs, as well as vocal hygiene strategies for minimizing laryngeal hyperfunction.    PATIENT EDUCATION: Education details: see Today's treatment Person educated: Patient Education method:  Explanation and Demonstration Education comprehension: verbalized understanding, returned demonstration, verbal cues required, and needs further education  HOME EXERCISE PROGRAM: HEP will be developed in upcoming sessions.   GOALS: Goals reviewed with patient? Yes  SHORT TERM GOALS: Target date: 02/02/2024  Pt will utilize structured voice exercises to achieve WNL vocal quality in 90% of opportunities given occasional min A. Baseline: Goal status: INITIAL  2.  Pt will perform diaphragmatic breathing during structured voice exercises to balance respiratory support for phonation in 80% of opportunities given occasional min A. Baseline:  Goal status: INITIAL  3.  Pt will complete voice PROM.  Baseline:  Goal status: INITIAL  4.  Pt will adhere to voice HEP program across 2 sessions to maximize generalization of skills for optimized outcomes.  Baseline:  Goal status: INITIAL  5.  Pt will show emergence of implementing vocal hygiene strategies to decrease laryngeal hyperfunction in daily living across 2 sessions.  Baseline:  Goal status: INITIAL   LONG TERM GOALS: Target date: 02/23/2024  Pt will achieve WNL vocal  quality. Baseline:  Goal status: INITIAL  2.  Pt will utilize diaphragmatic breathing to achieve healthy and optimal phonation with rare min A.  Baseline:  Goal status: INITIAL  3.  Pt will consistently utilize vocal hygiene strategies to maximize healthy phonation and voice habits.  Baseline:  Goal status: INITIAL    ASSESSMENT:  CLINICAL IMPRESSION: Patient is a 43 y.o. female who was seen today for voice therapy. 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 See treatment date above for today's date for further details on today's session.   On eval date she noted that her voice is 60% better since seeing laryngologist; however, her voice is not fully there yet. Pt stated that she uses her voice a lot during the day and that she is an excessive talker. Pt's voice is worse when she has to get loud with her kids. Pt has moments where she has excessive saliva, which messes with her when lying down (will inquire more about this in upcoming sessions). According to stimulability performance, pt is a great candidate for voice therapy. Pt has 15-20 minutes of free time at home for voice home exercises.   OBJECTIVE IMPAIRMENTS: include voice disorder. These impairments are limiting patient from effectively communicating at home and in community. Factors affecting potential to achieve goals and functional outcome are N/A.SABRA Patient will benefit from skilled SLP services to address above impairments and improve overall function.  REHAB POTENTIAL: Excellent  PLAN:  SLP FREQUENCY: 1x/week  SLP DURATION: 6 weeks  PLANNED INTERVENTIONS: SLP instruction and feedback, Patient/family education, and resonant voice therapy, semi-occluded vocal tract exercises, vocal hygiene education.    Waddell Music, CF-SLP 01/26/2024, 4:27 PM

## 2024-02-02 ENCOUNTER — Ambulatory Visit

## 2024-02-02 DIAGNOSIS — R490 Dysphonia: Secondary | ICD-10-CM

## 2024-02-02 NOTE — Therapy (Signed)
 OUTPATIENT SPEECH LANGUAGE PATHOLOGY VOICE TREATMENT   Patient Name: Danielle Wall MRN: [id number] DOB:August 15, 1980, 43 y.o., female Today's Date: 02/02/2024  PCP: Danielle Charm, MD REFERRING PROVIDER: Okey Burns, MD  END OF SESSION:  End of Session - 02/02/24 1439     Visit Number 4    Number of Visits 7    Date for Recertification  02/23/24    SLP Start Time 1400    SLP Stop Time  1446    SLP Time Calculation (min) 46 min    Activity Tolerance Patient tolerated treatment well             Past Medical History:  Diagnosis Date   Anxiety    Past Surgical History:  Procedure Laterality Date   CESAREAN SECTION     X 2   DILATION AND CURETTAGE OF UTERUS     LAPAROSCOPY     There are no active problems to display for this patient.   Onset date: 4-5 months ago.  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  Pt accompanied by: self and children  PERTINENT HISTORY: From Dr. Vallery note on 11/23/23:  Danielle Wall is a 43 year old female who presents with changes in her voice for the past 3-4 months.   She has experienced voice changes characterized by fluctuations with periods of improvement followed by sudden inability to speak. Her voice sometimes becomes silent while talking.   She has a history of migraines, previously treated with topiramate , which she no longer takes due to side effects. She reports no current migraines. She has experienced nasal congestion and facial pressure in the past, associated with her migraines.   She has a history of heartburn or reflux but is not currently taking any medication for it. She reports significant allergies this year, with symptoms including drainage from her eyes  and nose, although she typically does not experience such symptoms.   No trouble with swallowing but significant throat congestion and sensation of mucus blockage.  PAIN:  Are you having pain? No  FALLS: Has patient fallen in last 6 months? No, Number of falls: N/A   PATIENT GOALS: To improve voice.   OBJECTIVE:  Note: Objective measures were completed at Evaluation unless otherwise noted.  DIAGNOSTIC FINDINGS: Procedure: The patient was seated upright in the exam chair.   Topical lidocaine and Afrin were applied to the nasal cavity. After adequate anesthesia had occurred, the flexible telescope with strobe capabilities was passed into the nasal cavity. The nasopharynx was patent without mass or lesion. The scope was passed behind the soft palate and directed toward the base of tongue. The base of tongue was visualized and was symmetric with no apparent masses or abnormal appearing tissue. There were no signs of a mass or pooling of secretions in the piriform sinuses. The supraglottic structures were normal.   The true vocal cords are mobile. The medial edges were with hemorrhagic polyps along the edges, mid 1/3. Closure was incomplete. Periodicity present. The mucosal wave and amplitude were intact aside from the site of lesions. 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  The laryngoscope was then slowly withdrawn and the patient tolerated the procedure well. There were no complications or blood loss.   PATIENT REPORTED OUTCOME MEASURES (PROM): PROM  was not completed during session and will be addressed in upcoming session.                                                                                                                            TREATMENT DATE:  Resonant Voice therapy = RVT; Semi-Occluded Vocal Tract Exercises = SOVTE  02/02/24: Pt notes that she has not been practicing much due to no time and exhaustion at home. SLP educated pt about need for  continuous practice for making voice strategies into habits. SLP introduced flow phonation as a strategy for optimizing diaphragmatic breath support during voice production. Pt utilized flow phonation (consistent airflow) and diaphragmatic breath support with 90% consistency during structured voice exercises at phrase and sentence level given occasional min A. SLP reviewed RVT with English and [place] sentences. Pt utilized forward-focused resonance during sentence readings with 90% consistency given occasional min A. Pt describes that her voice feels more assertive and confident when she utilizes forward-resonance with consistent airflow. Cue of confident voice was beneficial for facilitating clear vocal quality during structured and unstructured voice exercises. SLP introduced conversation therapy training (CTT) for integrating forward-resonance and diaphragmatic breath support during conversation. Pt participated in conversation, achieving clear vocal quality is 85% of opportunities given frequent min A. Pt notes that she quit singing 4 months ago due to vocal fold polyp diagnoses. Pt wishes to return to singing for pleasure. SLP introduced vocal function exercises (VFEs) for optimizing sustained, balance phonation. Pt performed warm-up task from VFEs for ~13 seconds with clear vocal quality for 3 trials. Pt demonstrated emerging understanding of how to perform parts of VFEs. Plan is to continue CTT and VFEs for maximizing pt voice for QOL.   01/26/24: Pt has been trying to practice her RVT homework and reducing throat clearing at home. SLP reviewed RVT with /m and n/ words, phrases, and sentences. Pt utilized diaphragmatic breathing during structured exercises with 85% consistency when given frequent min A. Pt utilized forward resonance and diaphragmatic breathing during RVT exercises targeting /m, n/ words, phrases, and sentences with 90% consistency when given frequent min A. Pt is demonstrating  increasing self-awareness of forward vs. Back resonance for optimal phonation. Pt notes that she she uses multiple language throughout day. SLP collaborated with pt to begin creating functional phrases in target languages ([place] and English) for carry-over of voice strategies into daily conversation. Plan is to target sentence-level voice production using English and [place] sources for maximal generalization of balanced phonation, regardless of language utilized.   01/17/24: Pt will need PROM next session.   SLP reviewed pt's goals with her and pt agreed with goals. SLP introduced vocal hygiene strategies for pt (see pt instructions). Stressed to pt that she will likely need to work on limiting throat clearing and limiting frequency of talking. Throat clears today in this session were 13. SLP got pt cup of water after 8 minutes, and throat clears diminished after this (5). Given pt's  S about exercises, SLP re-introduced RVT and with SLP guidance pt worked with /m/ and /b/ phonemes with pt initially benefiting from tactile cue of finger on lips but faded this cue when pt agreed with SLP that her voice sounded WNL. Pt progressed to multiple /m/ words in succession (e.g., my morning music) with WNL voicing, however intonation was not like connected speech but more like separate words. SLP told pt to practice with single /m/ words (10) with a forward voicing focus, then mix 2-3 /m/ words in phrases focusing on feeling vibration in lips and feeling a voice forward production. SLP also encouraged pt to add /n/ words into 2-3 word phrases if she desires, feeling vibration in nose primarily (and mouth).   01/12/24: Pt's mom was present for session. SLP completed voice evaluation. SLP initiated voice therapy. SLP performed stimulability for resonant voice therapy (RVT), SOVTEs, and respiratory retraining. Pt was successful with utilizing diaphragmatic breathing and RVT/SOVTEs to achieve WNL vocal quality. SLP  educated pt about physiology of voice and how it connects to voice therapy strategies. Pt verbalized understanding; will need reinforcement in upcoming sessions. Plan is to initiate voice therapy, implementing RVT and SOVTEs, as well as vocal hygiene strategies for minimizing laryngeal hyperfunction.    PATIENT EDUCATION: Education details: see Today's treatment Person educated: Patient Education method: Explanation and Demonstration Education comprehension: verbalized understanding, returned demonstration, verbal cues required, and needs further education  HOME EXERCISE PROGRAM: HEP will be developed in upcoming sessions.   GOALS: Goals reviewed with patient? Yes  SHORT TERM GOALS: Target date: 02/02/2024  Pt will utilize structured voice exercises to achieve WNL vocal quality in 90% of opportunities given occasional min A. Baseline: Goal status: INITIAL  2.  Pt will perform diaphragmatic breathing during structured voice exercises to balance respiratory support for phonation in 80% of opportunities given occasional min A. Baseline:  Goal status: INITIAL  3.  Pt will complete voice PROM.  Baseline:  Goal status: INITIAL  4.  Pt will adhere to voice HEP program across 2 sessions to maximize generalization of skills for optimized outcomes.  Baseline:  Goal status: INITIAL  5.  Pt will show emergence of implementing vocal hygiene strategies to decrease laryngeal hyperfunction in daily living across 2 sessions.  Baseline:  Goal status: INITIAL   LONG TERM GOALS: Target date: 02/23/2024  Pt will achieve WNL vocal quality. Baseline:  Goal status: INITIAL  2.  Pt will utilize diaphragmatic breathing to achieve healthy and optimal phonation with rare min A.  Baseline:  Goal status: INITIAL  3.  Pt will consistently utilize vocal hygiene strategies to maximize healthy phonation and voice habits.  Baseline:  Goal status: INITIAL    ASSESSMENT:  CLINICAL  IMPRESSION: Patient is a 43 y.o. female who was seen today for voice therapy. 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 See treatment date above for today's date for further details on today's session.   On eval date she noted that her voice is 60% better since seeing laryngologist; however, her voice is not fully there yet. Pt stated that she uses her voice a lot during the day and that she is an excessive talker. Pt's voice is worse when she has to get loud with her kids. Pt has moments where she has excessive saliva, which messes with her when lying down (will inquire more about this in upcoming sessions). According to  stimulability performance, pt is a great candidate for voice therapy. Pt has 15-20 minutes of free time at home for voice home exercises.   OBJECTIVE IMPAIRMENTS: include voice disorder. These impairments are limiting patient from effectively communicating at home and in community. Factors affecting potential to achieve goals and functional outcome are N/A.SABRA Patient will benefit from skilled SLP services to address above impairments and improve overall function.  REHAB POTENTIAL: Excellent  PLAN:  SLP FREQUENCY: 1x/week  SLP DURATION: 6 weeks  PLANNED INTERVENTIONS: SLP instruction and feedback, Patient/family education, and resonant voice therapy, semi-occluded vocal tract exercises, vocal hygiene education.    Waddell Music, CF-SLP 02/02/2024, 2:49 PM

## 2024-02-09 ENCOUNTER — Ambulatory Visit

## 2024-02-14 ENCOUNTER — Ambulatory Visit

## 2024-02-20 ENCOUNTER — Ambulatory Visit (INDEPENDENT_AMBULATORY_CARE_PROVIDER_SITE_OTHER)

## 2024-02-21 ENCOUNTER — Ambulatory Visit

## 2024-02-21 DIAGNOSIS — R490 Dysphonia: Secondary | ICD-10-CM

## 2024-02-21 NOTE — Therapy (Signed)
 " OUTPATIENT SPEECH LANGUAGE PATHOLOGY VOICE TREATMENT-DISCHARGE   Patient Name: Danielle Wall MRN: [id number] DOB:10/29/1980, 43 y.o., female Today's Date: 02/21/2024  PCP: Elliot Charm, MD REFERRING PROVIDER: Okey Burns, MD (doc - [place], [place])  END OF SESSION:  End of Session - 02/21/24 1029     Visit Number 5    Number of Visits 7    Date for Recertification  02/23/24    SLP Start Time 1022    SLP Stop Time  1100    SLP Time Calculation (min) 38 min    Activity Tolerance Patient tolerated treatment well             Past Medical History:  Diagnosis Date   Anxiety    Past Surgical History:  Procedure Laterality Date   CESAREAN SECTION     X 2   DILATION AND CURETTAGE OF UTERUS     LAPAROSCOPY     There are no active problems to display for this patient.   SPEECH THERAPY DISCHARGE SUMMARY  Visits from Start of Care: 5  Current functional level related to goals / functional outcomes: Pt tells SLP her voice is approx 80-90% of WNL. She is happy with this state at this time. It's back to where it was before all of this, pt stated. She no longer feels voice sx she had prior to ST.    Remaining deficits: None reported.    Education / Equipment: See individual ST notes.    Patient agrees to discharge. Patient goals were partially met. Patient is being discharged due to being pleased with the current functional level..     Onset date: 4-5 months ago.  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 Vallery note on 11/23/23:  Danielle Wall is a 43 year old female who presents with changes in her voice for the past 3-4  months.   She has experienced voice changes characterized by fluctuations with periods of improvement followed by sudden inability to speak. Her voice sometimes becomes silent while talking.   She has a history of migraines, previously treated with topiramate , which she no longer takes due to side effects. She reports no current migraines. She has experienced nasal congestion and facial pressure in the past, associated with her migraines.   She has a history of heartburn or reflux but is not currently taking any medication for it. She reports significant allergies this year, with symptoms including drainage from her eyes and nose, although she typically does not experience such symptoms.   No trouble with swallowing but significant throat congestion and sensation of mucus blockage.  PAIN:  Are you having pain? No  FALLS: Has patient fallen in last 6 months? No, Number of falls: N/A   PATIENT GOALS: To improve voice.   OBJECTIVE:  Note: Objective measures were completed at Evaluation unless otherwise noted.  DIAGNOSTIC FINDINGS: Procedure: The patient was seated upright in the exam chair.   Topical lidocaine and Afrin were applied to the nasal cavity. After adequate anesthesia had occurred, the flexible telescope with strobe capabilities was passed into the nasal cavity. The nasopharynx was patent without mass or lesion. The scope was passed behind the soft palate and directed  toward the base of tongue. The base of tongue was visualized and was symmetric with no apparent masses or abnormal appearing tissue. There were no signs of a mass or pooling of secretions in the piriform sinuses. The supraglottic structures were normal.   The true vocal cords are mobile. The medial edges were with hemorrhagic polyps along the edges, mid 1/3. Closure was incomplete. Periodicity present. The mucosal wave and amplitude were intact aside from the site of lesions. There is moderate interarytenoid pachydermia  and post cricoid edema.  The laryngoscope was then slowly withdrawn and the patient tolerated the procedure well. There were no complications or blood loss.   PATIENT REPORTED OUTCOME MEASURES (PROM): PROM was not completed during session and will be addressed in upcoming session.                                                                                                                            TREATMENT DATE:  Resonant Voice therapy = RVT; Semi-Occluded Vocal Tract Exercises = SOVTE, AB=Abdominal breathing, conversation therapy training (CTT)   02/21/24: Twisha told SLP she is using vocal hygiene strategies regularly re: amount of vocal use and intensity (volume) of voice use, as well as incr'd water intake. Pt feels she is using her forward focused voice 60-70% in conversation. Today she demonstrated forward focus 75% and used AB in 15 minutes of CTT 85% of the time. She told SLP she is pleased with progress and demonstrates a desire for d/c today. SLP agreed.  02/02/24: Pt notes that she has not been practicing much due to no time and exhaustion at home. SLP educated pt about need for continuous practice for making voice strategies into habits. SLP introduced flow phonation as a strategy for optimizing diaphragmatic breath support during voice production. Pt utilized flow phonation (consistent airflow) and diaphragmatic breath support with 90% consistency during structured voice exercises at phrase and sentence level given occasional min A. SLP reviewed RVT with English and [place] sentences. Pt utilized forward-focused resonance during sentence readings with 90% consistency given occasional min A. Pt describes that her voice feels more assertive and confident when she utilizes forward-resonance with consistent airflow. Cue of confident voice was beneficial for facilitating clear vocal quality during structured and unstructured voice exercises. SLP introduced conversation therapy training  (CTT) for integrating forward-resonance and diaphragmatic breath support during conversation. Pt participated in conversation, achieving clear vocal quality is 85% of opportunities given frequent min A. Pt notes that she quit singing 4 months ago due to vocal fold polyp diagnoses. Pt wishes to return to singing for pleasure. SLP introduced vocal function exercises (VFEs) for optimizing sustained, balance phonation. Pt performed warm-up task from VFEs for ~13 seconds with clear vocal quality for 3 trials. Pt demonstrated emerging understanding of how to perform parts of VFEs. Plan is to continue CTT and VFEs for maximizing pt voice for QOL.   01/26/24: Pt has been trying to practice her RVT homework and  reducing throat clearing at home. SLP reviewed RVT with /m and n/ words, phrases, and sentences. Pt utilized diaphragmatic breathing during structured exercises with 85% consistency when given frequent min A. Pt utilized forward resonance and diaphragmatic breathing during RVT exercises targeting /m, n/ words, phrases, and sentences with 90% consistency when given frequent min A. Pt is demonstrating increasing self-awareness of forward vs. Back resonance for optimal phonation. Pt notes that she she uses multiple language throughout day. SLP collaborated with pt to begin creating functional phrases in target languages ([place] and English) for carry-over of voice strategies into daily conversation. Plan is to target sentence-level voice production using English and [place] sources for maximal generalization of balanced phonation, regardless of language utilized.   01/17/24: Pt will need PROM next session.   SLP reviewed pt's goals with her and pt agreed with goals. SLP introduced vocal hygiene strategies for pt (see pt instructions). Stressed to pt that she will likely need to work on limiting throat clearing and limiting frequency of talking. Throat clears today in this session were 13. SLP got pt cup of  water after 8 minutes, and throat clears diminished after this (5). Given pt's S about exercises, SLP re-introduced RVT and with SLP guidance pt worked with /m/ and /b/ phonemes with pt initially benefiting from tactile cue of finger on lips but faded this cue when pt agreed with SLP that her voice sounded WNL. Pt progressed to multiple /m/ words in succession (e.g., my morning music) with WNL voicing, however intonation was not like connected speech but more like separate words. SLP told pt to practice with single /m/ words (10) with a forward voicing focus, then mix 2-3 /m/ words in phrases focusing on feeling vibration in lips and feeling a voice forward production. SLP also encouraged pt to add /n/ words into 2-3 word phrases if she desires, feeling vibration in nose primarily (and mouth).   01/12/24: Pt's mom was present for session. SLP completed voice evaluation. SLP initiated voice therapy. SLP performed stimulability for resonant voice therapy (RVT), SOVTEs, and respiratory retraining. Pt was successful with utilizing diaphragmatic breathing and RVT/SOVTEs to achieve WNL vocal quality. SLP educated pt about physiology of voice and how it connects to voice therapy strategies. Pt verbalized understanding; will need reinforcement in upcoming sessions. Plan is to initiate voice therapy, implementing RVT and SOVTEs, as well as vocal hygiene strategies for minimizing laryngeal hyperfunction.    PATIENT EDUCATION: Education details: see Today's treatment Person educated: Patient Education method: Explanation and Demonstration Education comprehension: verbalized understanding, returned demonstration, verbal cues required, and needs further education  HOME EXERCISE PROGRAM: HEP will be developed in upcoming sessions.   GOALS: Goals reviewed with patient? Yes  SHORT TERM GOALS: Target date: 02/02/2024  Pt will utilize structured voice exercises to achieve WNL vocal quality in 90% of  opportunities given occasional min A. Baseline: Goal status: INITIAL  2.  Pt will perform diaphragmatic breathing during structured voice exercises to balance respiratory support for phonation in 80% of opportunities given occasional min A. Baseline:  Goal status: INITIAL  3.  Pt will complete voice PROM.  Baseline:  Goal status: INITIAL  4.  Pt will adhere to voice HEP program across 2 sessions to maximize generalization of skills for optimized outcomes.  Baseline:  Goal status: INITIAL  5.  Pt will show emergence of implementing vocal hygiene strategies to decrease laryngeal hyperfunction in daily living across 2 sessions.  Baseline:  Goal status: INITIAL   LONG TERM  GOALS: Target date: 02/23/2024  Pt will achieve WNL vocal quality. Baseline:  Goal status: partially met  2.  Pt will utilize diaphragmatic breathing to achieve healthy and optimal phonation with rare min A.  Baseline:  Goal status: met  3.  Pt will consistently utilize vocal hygiene strategies to maximize healthy phonation and voice habits.  Baseline:  Goal status: met    ASSESSMENT:  CLINICAL IMPRESSION: Patient is a 43 y.o. female who was seen today for voice therapy. PSee treatment date above for today's date for further details on today's session.  She demonstrates desire for d/c today and SLP agrees. On eval date she noted that her voice is 60% better since seeing laryngologist; however, her voice is not fully there yet. Pt stated that she uses her voice a lot during the day and that she is an excessive talker. Pt's voice is worse when she has to get loud with her kids. Pt has moments where she has excessive saliva, which messes with her when lying down (will inquire more about this in upcoming sessions). According to stimulability performance, pt is a great candidate for voice therapy. Pt has 15-20 minutes of free time at home for voice home exercises.   OBJECTIVE IMPAIRMENTS: include voice  disorder. These impairments are limiting patient from effectively communicating at home and in community. Factors affecting potential to achieve goals and functional outcome are N/A.SABRA Patient will benefit from skilled SLP services to address above impairments and improve overall function.  REHAB POTENTIAL: Excellent  PLAN: discharge today  PLANNED INTERVENTIONS: SLP instruction and feedback, Patient/family education, and resonant voice therapy, semi-occluded vocal tract exercises, vocal hygiene education.    Waddell Music, CF-SLP 02/21/2024, 10:30 AM      "

## 2024-02-24 ENCOUNTER — Ambulatory Visit (INDEPENDENT_AMBULATORY_CARE_PROVIDER_SITE_OTHER)

## 2024-02-29 ENCOUNTER — Ambulatory Visit (INDEPENDENT_AMBULATORY_CARE_PROVIDER_SITE_OTHER)

## 2024-02-29 ENCOUNTER — Encounter (INDEPENDENT_AMBULATORY_CARE_PROVIDER_SITE_OTHER): Payer: Self-pay

## 2024-02-29 VITALS — BP 102/65 | HR 61 | Temp 98.1°F

## 2024-02-29 DIAGNOSIS — J381 Polyp of vocal cord and larynx: Secondary | ICD-10-CM | POA: Diagnosis not present

## 2024-02-29 DIAGNOSIS — R490 Dysphonia: Secondary | ICD-10-CM

## 2024-02-29 NOTE — Progress Notes (Signed)
 Dear Dr. Elliot, Here is my assessment for our mutual patient, Danielle Wall. Thank you for allowing me the opportunity to care for your patient. Please do not hesitate to contact me should you have any other questions. Sincerely, Dr. Hadassah Parody  Otolaryngology Clinic Note Referring provider: Dr. Elliot HPI:   HPI (02/29/24) 44 year old female who presents for follow-up on hoarseness.  She is a previous patient with Dr. Soldatova.  Last seen 11/23/2023.  At that time she was experiencing voice changes characterized by fluctuations with periods of improvement followed by sudden inability to speak.  Her voice sometimes becomes silent when she is talking.  She has a history of heartburn but is not currently taking any medications for it.  She reports significant allergies this year with symptoms including drainage from eyes and nose although she does not typically experience the symptoms.  At that visit, she was noted to have hemorrhagic polyps along the edges mid one third with incomplete closure.  She had moderate interarytenoid pachydermia and postcricoid edema.  At that time, told her to expect spontaneous resolution of hemorrhagic polyps and recommended voice rest x 48 hours and steroids and Bactrim .  Recommended referral to SLP for voice hygiene  Presents for follow-up after voice therapy.  She feels like her voice is nearly back to baseline.  No longer experiences pain with speaking.  She is happy with how her voice is at this time   Independent Review of Additional Tests or Records:  SLP note 02/21/2024 Lupita Connor, CCC--SLP reviewed: At that time was telling SLP her voice was 80 to 90% within normal limits.  She was happy with this at that time.  It is back to where it was before all of this.  Notes by Elena Larry on 11/23/2023 independently reviewed and discussed above   PMH/Meds/All/SocHx/FamHx/ROS:   Past Medical History:  Diagnosis Date   Anxiety       Past Surgical History:  Procedure Laterality Date   CESAREAN SECTION     X 2   DILATION AND CURETTAGE OF UTERUS     LAPAROSCOPY      No family history on file.   Social Connections: Unknown (07/07/2021)   [facility]   Social Network    Social Network: Not on file     Current Outpatient Medications  Medication Instructions   busPIRone (BUSPAR) 5 mg, 2 times daily   hydrocortisone  2.5 % cream Topical, 2 times daily PRN   ibuprofen (ADVIL) 800 mg, 3 times daily   levocetirizine (XYZAL) 5 mg, Daily   levonorgestrel (MIRENA) 20 MCG/DAY IUD 1 each,  Once   methylPREDNISolone  (MEDROL  DOSEPAK) 4 MG TBPK tablet Take with signs of chronic sinusitis and take as directed   mupirocin  ointment (BACTROBAN ) 2 % 1 application , Topical, 2 times daily   omeprazole (PRILOSEC) 40 mg, Daily   ondansetron  (ZOFRAN ) 4 mg, Oral, Every 8 hours PRN   rizatriptan  (MAXALT ) 10 mg, Oral, As needed, May repeat in 2 hours if needed.  Maximum 2 tablets in 24 hours.   sulfamethoxazole -trimethoprim  (BACTRIM  DS) 800-160 MG tablet 1 tablet, Oral, 2 times daily   topiramate  (TOPAMAX ) 50 mg, Oral, Daily at bedtime     Physical Exam:   BP 102/65 (BP Location: Left Arm, Patient Position: Sitting)   Pulse 61   Temp 98.1 F (36.7 C)   SpO2 100%   Salient findings:  CN II-XII intact   Bilateral EAC clear and TM intact with well pneumatized middle  ear spaces  Anterior rhinoscopy: bilateral inferior turbinates with mild hypertrophy   No lesions of oral cavity/oropharynx  No obviously palpable neck masses/lymphadenopathy/thyromegaly  No respiratory distress or stridor No dysphonia   Seprately Identifiable Procedures:  Prior to initiating any procedures, risks/benefits/alternatives were explained to the patient and verbal consent obtained.  Procedure Note (02/29/2024) Pre-procedure diagnosis: History of dysphonia and hemorrhagic polyps Post-procedure diagnosis: Same Procedure: Transnasal  Fiberoptic Laryngoscopy, CPT 31575 - Mod 25 Indication: History of dysphonia and hemorrhagic polyps Complications: None apparent EBL: 0 mL  The procedure was undertaken to further evaluate the patient's complaint of prior dysphonia and hemorrhagic polyps, with mirror exam inadequate for appropriate examination due to gag reflex and poor patient tolerance  Procedure:  Patient was identified as correct patient. Verbal consent was obtained. The nose was sprayed with oxymetazoline and 4% lidocaine. The The flexible laryngoscope was passed through the nose to view the nasal cavity, pharynx (oropharynx, hypopharynx) and larynx.  The larynx was examined at rest and during multiple phonatory tasks. Documentation was obtained and reviewed with patient. The scope was removed. The patient tolerated the procedure well.  Findings: The nasal cavity and nasopharynx did not reveal any masses or lesions, mucosa appeared to be without obvious lesions. The tongue base, pharyngeal walls, piriform sinuses, vallecula, epiglottis and postcricoid region are normal in appearance. The visualized portion of the subglottis and proximal trachea is widely patent. The vocal folds are mobile bilaterally. There are no lesions on the free edge of the vocal folds nor elsewhere in the larynx worrisome for malignancy.    Electronically signed by: Hadassah JAYSON Parody, MD 03/04/2024 4:04 PM   Impression & Plans:  Danielle Wall is a 44 y.o. female with   1. Vocal fold polyp   2. Dysphonia     Assessment and Plan Assessment & Plan Dysphonia Vocal fold polyp  Dysphonia has improved with 80-90% return to baseline. Laryngoscopy demonstrated normal vocal cords and resolution of prior pathology.  - Performed laryngoscopic examination, which showed normal vocal cords and resolution of previous pathology. - Provided reassurance regarding absence of structural vocal cord pathology.  If she notices worsening in her voice she can return  to see us  as needed   See below regarding exact medications prescribed this encounter including dosages and route: No orders of the defined types were placed in this encounter.   I personally spent a total of 30 minutes in the care of the patient today including preparing to see the patient, getting/reviewing separately obtained history, performing a medically appropriate exam/evaluation, and counseling and educating.   Thank you for allowing me the opportunity to care for your patient. Please do not hesitate to contact me should you have any other questions.  Sincerely, Hadassah Parody, MD Otolaryngologist (ENT), [facility] Phone: [phone] Fax: [phone]
